# Patient Record
Sex: Male | Born: 1955 | Race: Black or African American | Hispanic: No | Smoking: Current every day smoker
Health system: Southern US, Community
[De-identification: ages and names within clinical notes are randomized; demographics above are authoritative.]

## PROBLEM LIST (undated history)

## (undated) DIAGNOSIS — N289 Disorder of kidney and ureter, unspecified: Secondary | ICD-10-CM

## (undated) DIAGNOSIS — I10 Essential (primary) hypertension: Secondary | ICD-10-CM

---

## 2016-04-03 ENCOUNTER — Encounter: Payer: Self-pay | Admitting: Emergency Medicine

## 2016-04-03 ENCOUNTER — Emergency Department: Payer: Medicaid Other

## 2016-04-03 ENCOUNTER — Inpatient Hospital Stay
Admission: EM | Admit: 2016-04-03 | Discharge: 2016-04-06 | DRG: 640 | Disposition: A | Discharge status: Home / Self Care | Payer: Medicaid Other | Attending: Internal Medicine | Admitting: Internal Medicine

## 2016-04-03 DIAGNOSIS — N186 End stage renal disease: Secondary | ICD-10-CM | POA: Diagnosis present

## 2016-04-03 DIAGNOSIS — I509 Heart failure, unspecified: Secondary | ICD-10-CM | POA: Diagnosis present

## 2016-04-03 DIAGNOSIS — F17210 Nicotine dependence, cigarettes, uncomplicated: Secondary | ICD-10-CM | POA: Diagnosis present

## 2016-04-03 DIAGNOSIS — J9601 Acute respiratory failure with hypoxia: Secondary | ICD-10-CM | POA: Diagnosis present

## 2016-04-03 DIAGNOSIS — Z79899 Other long term (current) drug therapy: Secondary | ICD-10-CM | POA: Diagnosis not present

## 2016-04-03 DIAGNOSIS — I132 Hypertensive heart and chronic kidney disease with heart failure and with stage 5 chronic kidney disease, or end stage renal disease: Secondary | ICD-10-CM | POA: Diagnosis present

## 2016-04-03 DIAGNOSIS — Z9111 Patient's noncompliance with dietary regimen: Secondary | ICD-10-CM

## 2016-04-03 DIAGNOSIS — Z23 Encounter for immunization: Secondary | ICD-10-CM | POA: Diagnosis not present

## 2016-04-03 DIAGNOSIS — I10 Essential (primary) hypertension: Secondary | ICD-10-CM

## 2016-04-03 DIAGNOSIS — R7989 Other specified abnormal findings of blood chemistry: Secondary | ICD-10-CM

## 2016-04-03 DIAGNOSIS — R042 Hemoptysis: Secondary | ICD-10-CM | POA: Diagnosis present

## 2016-04-03 DIAGNOSIS — Z992 Dependence on renal dialysis: Secondary | ICD-10-CM | POA: Diagnosis not present

## 2016-04-03 DIAGNOSIS — J811 Chronic pulmonary edema: Secondary | ICD-10-CM

## 2016-04-03 DIAGNOSIS — E8770 Fluid overload, unspecified: Principal | ICD-10-CM | POA: Diagnosis present

## 2016-04-03 DIAGNOSIS — E1122 Type 2 diabetes mellitus with diabetic chronic kidney disease: Secondary | ICD-10-CM | POA: Diagnosis present

## 2016-04-03 DIAGNOSIS — N2581 Secondary hyperparathyroidism of renal origin: Secondary | ICD-10-CM | POA: Diagnosis present

## 2016-04-03 DIAGNOSIS — R778 Other specified abnormalities of plasma proteins: Secondary | ICD-10-CM

## 2016-04-03 DIAGNOSIS — D72829 Elevated white blood cell count, unspecified: Secondary | ICD-10-CM

## 2016-04-03 DIAGNOSIS — I248 Other forms of acute ischemic heart disease: Secondary | ICD-10-CM | POA: Diagnosis present

## 2016-04-03 DIAGNOSIS — D631 Anemia in chronic kidney disease: Secondary | ICD-10-CM | POA: Diagnosis present

## 2016-04-03 DIAGNOSIS — R0902 Hypoxemia: Secondary | ICD-10-CM

## 2016-04-03 DIAGNOSIS — I379 Nonrheumatic pulmonary valve disorder, unspecified: Secondary | ICD-10-CM | POA: Diagnosis not present

## 2016-04-03 DIAGNOSIS — R0602 Shortness of breath: Secondary | ICD-10-CM | POA: Diagnosis present

## 2016-04-03 DIAGNOSIS — D649 Anemia, unspecified: Secondary | ICD-10-CM

## 2016-04-03 DIAGNOSIS — J189 Pneumonia, unspecified organism: Secondary | ICD-10-CM

## 2016-04-03 DIAGNOSIS — J810 Acute pulmonary edema: Secondary | ICD-10-CM

## 2016-04-03 HISTORY — DX: Disorder of kidney and ureter, unspecified: N28.9

## 2016-04-03 HISTORY — DX: Essential (primary) hypertension: I10

## 2016-04-03 LAB — COMPREHENSIVE METABOLIC PANEL
ALK PHOS: 154 U/L — AB (ref 38–126)
ALK PHOS: 143 U/L — AB (ref 38–126)
ALT: 28 U/L (ref 17–63)
ALT: 25 U/L (ref 17–63)
ANION GAP: 14 (ref 5–15)
AST: 32 U/L (ref 15–41)
AST: 26 U/L (ref 15–41)
Albumin: 4 g/dL (ref 3.5–5.0)
Albumin: 3.7 g/dL (ref 3.5–5.0)
Anion gap: 13 (ref 5–15)
BILIRUBIN TOTAL: 0.7 mg/dL (ref 0.3–1.2)
BILIRUBIN TOTAL: 0.8 mg/dL (ref 0.3–1.2)
BUN: 56 mg/dL — ABNORMAL HIGH (ref 6–20)
BUN: 48 mg/dL — ABNORMAL HIGH (ref 6–20)
CALCIUM: 9.1 mg/dL (ref 8.9–10.3)
CALCIUM: 8.9 mg/dL (ref 8.9–10.3)
CO2: 26 mmol/L (ref 22–32)
CO2: 26 mmol/L (ref 22–32)
CREATININE: 7.73 mg/dL — AB (ref 0.61–1.24)
Chloride: 102 mmol/L (ref 101–111)
Chloride: 100 mmol/L — ABNORMAL LOW (ref 101–111)
Creatinine, Ser: 9.21 mg/dL — ABNORMAL HIGH (ref 0.61–1.24)
GFR calc Af Amer: 6 mL/min — ABNORMAL LOW (ref >60)
GFR, EST AFRICAN AMERICAN: 8 mL/min — AB (ref >60)
GFR, EST NON AFRICAN AMERICAN: 5 mL/min — AB (ref >60)
GFR, EST NON AFRICAN AMERICAN: 7 mL/min — AB (ref >60)
Glucose, Bld: 93 mg/dL (ref 65–99)
Glucose, Bld: 88 mg/dL (ref 65–99)
POTASSIUM: 4.6 mmol/L (ref 3.5–5.1)
Potassium: 4.1 mmol/L (ref 3.5–5.1)
Sodium: 141 mmol/L (ref 135–145)
Sodium: 140 mmol/L (ref 135–145)
TOTAL PROTEIN: 8.7 g/dL — AB (ref 6.5–8.1)
TOTAL PROTEIN: 8.2 g/dL — AB (ref 6.5–8.1)

## 2016-04-03 LAB — CBC
HCT: 35.7 % — ABNORMAL LOW (ref 40.0–52.0)
HEMOGLOBIN: 11.8 g/dL — AB (ref 13.0–18.0)
MCH: 28.7 pg (ref 26.0–34.0)
MCHC: 33.1 g/dL (ref 32.0–36.0)
MCV: 86.7 fL (ref 80.0–100.0)
Platelets: 186 10*3/uL (ref 150–440)
RBC: 4.12 MIL/uL — ABNORMAL LOW (ref 4.40–5.90)
RDW: 14.1 % (ref 11.5–14.5)
WBC: 11.4 10*3/uL — AB (ref 3.8–10.6)

## 2016-04-03 LAB — PHOSPHORUS: Phosphorus: 3.5 mg/dL (ref 2.5–4.6)

## 2016-04-03 LAB — TROPONIN I
TROPONIN I: 0.03 ng/mL — AB (ref <0.03)
TROPONIN I: 0.05 ng/mL — AB (ref <0.03)
TROPONIN I: 0.04 ng/mL — AB (ref <0.03)

## 2016-04-03 LAB — MRSA PCR SCREENING: MRSA by PCR: NEGATIVE

## 2016-04-03 LAB — TSH: TSH: 1.156 u[IU]/mL (ref 0.350–4.500)

## 2016-04-03 LAB — PROCALCITONIN: PROCALCITONIN: 1.1 ng/mL

## 2016-04-03 LAB — GLUCOSE, CAPILLARY: Glucose-Capillary: 87 mg/dL (ref 65–99)

## 2016-04-03 MED ORDER — ONDANSETRON HCL 4 MG/2ML IJ SOLN: 4.0000 mg | Freq: Four times a day (QID) | INTRAMUSCULAR | Status: DC | PRN

## 2016-04-03 MED ORDER — ASPIRIN EC 81 MG PO TBEC
81.0000 mg | DELAYED_RELEASE_TABLET | Freq: Every day | ORAL | Status: DC
  Administered 2016-04-03 – 2016-04-06 (×4): 81 mg via ORAL
  Filled 2016-04-03 (×4): qty 1

## 2016-04-03 MED ORDER — MORPHINE SULFATE (PF) 4 MG/ML IV SOLN
2.0000 mg | INTRAVENOUS | Status: DC | PRN
  Administered 2016-04-03: 4 mg via INTRAVENOUS
  Administered 2016-04-04 (×2): 2 mg via INTRAVENOUS
  Administered 2016-04-05 (×3): 4 mg via INTRAVENOUS
  Filled 2016-04-03 (×6): qty 1

## 2016-04-03 MED ORDER — ACETAMINOPHEN 325 MG PO TABS
650.0000 mg | ORAL_TABLET | Freq: Four times a day (QID) | ORAL | Status: DC | PRN
  Administered 2016-04-03: 650 mg via ORAL
  Filled 2016-04-03: qty 2

## 2016-04-03 MED ORDER — SODIUM CHLORIDE 0.9% FLUSH
3.0000 mL | Freq: Two times a day (BID) | INTRAVENOUS | Status: DC
  Administered 2016-04-03 – 2016-04-05 (×5): 3 mL via INTRAVENOUS

## 2016-04-03 MED ORDER — SODIUM CHLORIDE 0.9% FLUSH
3.0000 mL | Freq: Two times a day (BID) | INTRAVENOUS | Status: DC
  Administered 2016-04-03: 3 mL via INTRAVENOUS

## 2016-04-03 MED ORDER — AMLODIPINE BESYLATE 10 MG PO TABS
10.0000 mg | ORAL_TABLET | Freq: Every day | ORAL | Status: DC
Start: 2016-04-04 — End: 2016-04-06
  Administered 2016-04-04 – 2016-04-06 (×3): 10 mg via ORAL
  Filled 2016-04-03 (×3): qty 1

## 2016-04-03 MED ORDER — HYDRALAZINE HCL 20 MG/ML IJ SOLN
10.0000 mg | INTRAMUSCULAR | Status: DC | PRN
  Administered 2016-04-03: 20 mg via INTRAVENOUS
  Administered 2016-04-03: 10 mg via INTRAVENOUS
  Administered 2016-04-03: 20 mg via INTRAVENOUS
  Filled 2016-04-03 (×2): qty 1

## 2016-04-03 MED ORDER — HYDRALAZINE HCL 20 MG/ML IJ SOLN
10.0000 mg | INTRAMUSCULAR | Status: DC | PRN
  Filled 2016-04-03: qty 1

## 2016-04-03 MED ORDER — SODIUM CHLORIDE 0.9 % IV SOLN: 250.0000 mL | INTRAVENOUS | Status: DC | PRN

## 2016-04-03 MED ORDER — PNEUMOCOCCAL VAC POLYVALENT 25 MCG/0.5ML IJ INJ
0.5000 mL | INJECTION | INTRAMUSCULAR | Status: AC
  Administered 2016-04-04: 0.5 mL via INTRAMUSCULAR
  Filled 2016-04-03: qty 0.5

## 2016-04-03 MED ORDER — NITROGLYCERIN 2 % TD OINT
1.0000 [in_us] | TOPICAL_OINTMENT | Freq: Four times a day (QID) | TRANSDERMAL | Status: DC
  Administered 2016-04-03 – 2016-04-06 (×10): 1 [in_us] via TOPICAL
  Filled 2016-04-03 (×10): qty 1

## 2016-04-03 MED ORDER — LEVOFLOXACIN IN D5W 750 MG/150ML IV SOLN
750.0000 mg | Freq: Once | INTRAVENOUS | Status: DC
  Administered 2016-04-03: 750 mg via INTRAVENOUS
  Filled 2016-04-03: qty 150

## 2016-04-03 MED ORDER — ONDANSETRON HCL 4 MG PO TABS: 4.0000 mg | ORAL_TABLET | Freq: Four times a day (QID) | ORAL | Status: DC | PRN

## 2016-04-03 MED ORDER — LOSARTAN POTASSIUM 50 MG PO TABS
100.0000 mg | ORAL_TABLET | Freq: Every day | ORAL | Status: DC
  Administered 2016-04-04 – 2016-04-06 (×3): 100 mg via ORAL
  Filled 2016-04-03 (×4): qty 2

## 2016-04-03 MED ORDER — SODIUM CHLORIDE 0.9% FLUSH: 3.0000 mL | INTRAVENOUS | Status: DC | PRN

## 2016-04-03 MED ORDER — NITROGLYCERIN 2 % TD OINT
1.0000 [in_us] | TOPICAL_OINTMENT | Freq: Once | TRANSDERMAL | Status: AC
  Administered 2016-04-03: 1 [in_us] via TOPICAL
  Filled 2016-04-03: qty 1

## 2016-04-03 MED ORDER — HYDROCODONE-ACETAMINOPHEN 5-325 MG PO TABS
1.0000 | ORAL_TABLET | ORAL | Status: DC | PRN
  Administered 2016-04-04: 2 via ORAL
  Filled 2016-04-03: qty 2

## 2016-04-03 MED ORDER — LEVOFLOXACIN 250 MG PO TABS
250.0000 mg | ORAL_TABLET | ORAL | Status: DC
  Filled 2016-04-03: qty 1

## 2016-04-03 MED ORDER — HYDRALAZINE HCL 50 MG PO TABS
50.0000 mg | ORAL_TABLET | Freq: Three times a day (TID) | ORAL | Status: DC
  Administered 2016-04-03 – 2016-04-04 (×4): 50 mg via ORAL
  Filled 2016-04-03 (×6): qty 1

## 2016-04-03 MED ORDER — ACETAMINOPHEN 650 MG RE SUPP: 650.0000 mg | Freq: Four times a day (QID) | RECTAL | Status: DC | PRN

## 2016-04-03 MED ORDER — METOPROLOL TARTRATE 50 MG PO TABS
100.0000 mg | ORAL_TABLET | Freq: Two times a day (BID) | ORAL | Status: DC
  Administered 2016-04-03 – 2016-04-06 (×5): 100 mg via ORAL
  Filled 2016-04-03 (×5): qty 2

## 2016-04-03 NOTE — Plan of Care (Signed)
Problem: Physical Regulation: Goal: Will remain free from infection Outcome: Not Progressing Variance: Slow or unresponsive to therapy Comments: Patient has a temp of 102.1 and SBP over 190. Slow to respond to IV antihypertensives.

## 2016-04-03 NOTE — Progress Notes (Signed)
CH responded to a OR for an AD. Pt was awake. CH educated on the AD. Pt will review and complete later. CH is available for follow up as needed.    04/03/16 1700  Clinical Encounter Type  Visited With Patient;Patient and family together  Visit Type Initial;Spiritual support  Referral From Nurse  Consult/Referral To Chaplain  Spiritual Encounters  Spiritual Needs Literature

## 2016-04-03 NOTE — H&P (Addendum)
[facility] - [place] at [facility]   PATIENT NAME: Terry Riley    MR#:  [id number]  DATE OF BIRTH:  Sep 02, 1955  DATE OF ADMISSION:  04/03/2016  PRIMARY CARE PHYSICIAN: No primary care provider on file.   REQUESTING/REFERRING PHYSICIAN:   CHIEF COMPLAINT:   Chief Complaint  Patient presents with  . Shortness of Breath    HISTORY OF PRESENT ILLNESS: Nikola Marone  is a 61 y.o. male with a known history of End-stage renal disease, initiated September 2016, left upper extremity graft, hypertension, who presents to the hospital with complaints of significant shortness of breath, resected yesterday. Patient had his hemodialysis on March 27, he felt relatively good, however, yesterday started having coughing with blood, frothy phlegm, no fevers. On arrival to emergency room, patient's oxygen saturations were in the 70s on room air, patient was initiated on 6 L of oxygen with improvement to 91%. Hospitalist services were contacted for admission. The patient complains of two-pillow orthopnea and PND.   PAST MEDICAL HISTORY:   Past Medical History:  Diagnosis Date  . Hypertension   . Renal disorder     PAST SURGICAL HISTORY: No past surgical history on file.  SOCIAL HISTORY:  Social History  Substance Use Topics  . Smoking status: Current Every Day Smoker    Packs/day: 0.50    Types: Cigarettes  . Smokeless tobacco: Never Used  . Alcohol use Yes    FAMILY HISTORY: No Early coronary artery disease.  DRUG ALLERGIES: No Known Allergies  Review of Systems  Constitutional: Positive for malaise/fatigue and weight loss. Negative for chills and fever.  HENT: Negative for congestion.   Eyes: Negative for blurred vision and double vision.  Respiratory: Positive for cough, hemoptysis, shortness of breath and wheezing. Negative for sputum production.   Cardiovascular: Positive for chest pain, palpitations, orthopnea, leg swelling and PND.  Gastrointestinal:  Positive for blood in stool, constipation and diarrhea. Negative for abdominal pain, nausea and vomiting.  Genitourinary: Negative for dysuria, frequency, hematuria and urgency.  Musculoskeletal: Negative for falls.  Neurological: Negative for dizziness, tremors, focal weakness and headaches.  Endo/Heme/Allergies: Does not bruise/bleed easily.  Psychiatric/Behavioral: Negative for depression. The patient does not have insomnia.     MEDICATIONS AT HOME:  Prior to Admission medications   Medication Sig Start Date End Date Taking? Authorizing Provider  amLODipine (NORVASC) 10 MG tablet Take 10 mg by mouth daily.   Yes Historical Provider, MD  hydrALAZINE (APRESOLINE) 50 MG tablet Take 50 mg by mouth 3 (three) times daily.   Yes Historical Provider, MD  losartan (COZAAR) 100 MG tablet Take 100 mg by mouth daily.   Yes Historical Provider, MD  metoprolol (LOPRESSOR) 100 MG tablet Take 100 mg by mouth 2 (two) times daily.   Yes Historical Provider, MD      PHYSICAL EXAMINATION:   VITAL SIGNS: Blood pressure (!) 178/88, temperature 99.2 F (37.3 C), temperature source Oral, resp. rate 18, height 6' (1.829 m), weight 77.1 kg (170 lb), SpO2 (!) 77 %.  GENERAL:  61 y.o.-year-old patient lying in the bed In moderate respiratory distress.  EYES: Pupils equal, round, reactive to light and accommodation. No scleral icterus. Extraocular muscles intact.  HEENT: Head atraumatic, normocephalic. Oropharynx and nasopharynx clear.  NECK:  Supple, significant jugular venous distention up to the mandible angle. No thyroid enlargement, no tenderness.  LUNGS: Some diminished breath sounds bilaterally, no wheezing, bilateral rales,rhonchi and crepitations , more on the right side. Intermittent use of  accessory muscles of respiration. Tachypneic, uncomfortable, somewhat sleepy CARDIOVASCULAR: S1, S2 , tachycardic. No murmurs, rubs, or gallops.  ABDOMEN: Soft, nontender, nondistended. Bowel sounds present. No  organomegaly or mass.  EXTREMITIES: Trace lower extremity and pedal edema, no cyanosis, or clubbing.  NEUROLOGIC: Cranial nerves II through XII are intact. Muscle strength 5/5 in all extremities. Sensation intact. Gait not checked.  PSYCHIATRIC: The patient is somnolent, however, able to open his eyes, converses, oriented x 3.  SKIN: No obvious rash, lesion, or ulcer.   LABORATORY PANEL:   CBC  Recent Labs Lab 04/03/16 1133  WBC 11.4*  HGB 11.8*  HCT 35.7*  PLT 186  MCV 86.7  MCH 28.7  MCHC 33.1  RDW 14.1   ------------------------------------------------------------------------------------------------------------------  Chemistries   Recent Labs Lab 04/03/16 1133  NA 141  K 4.6  CL 102  CO2 26  GLUCOSE 93  BUN 56*  CREATININE 9.21*  CALCIUM 9.1  AST 32  ALT 28  ALKPHOS 154*  BILITOT 0.7   ------------------------------------------------------------------------------------------------------------------  Cardiac Enzymes  Recent Labs Lab 04/03/16 1133  TROPONINI 0.03*   ------------------------------------------------------------------------------------------------------------------  RADIOLOGY: Dg Chest 2 View  Result Date: 04/03/2016 CLINICAL DATA:  Pt dialysis patient, reports he had dialysis on Friday, was released Saturday and reports shortness of breath started today. Fiance reports pt has been drinking alcohol, smoking cigarettes and eating bad foods for him. Pt isn't able to start new dialysis until Tuesday. Hx/o HT; smoker. EXAM: CHEST  2 VIEW COMPARISON:  None. FINDINGS: Cardiac silhouette is top-normal in size. No mediastinal or hilar masses. No convincing adenopathy. There are irregular interstitial opacities and hazy airspace opacity bilaterally, most evident on the right. Thickening of the fissures is noted. The lungs are hyperexpanded. There is no pleural effusion. No pneumothorax. Skeletal structures are intact. IMPRESSION: 1. Interstitial and hazy  airspace opacities most evident on the right. Pneumonia is suspected. Alternatively, this could reflect asymmetric pulmonary edema. Electronically Signed   By: Amie Portland M.D.   On: 04/03/2016 11:52    EKG: Orders placed or performed during the hospital encounter of 04/03/16  . EKG 12-Lead  . EKG 12-Lead  EKG in the emergency room revealed normal sinus rhythm at 93 beats per minute, left axis deviation, possible left atrial enlargement. EKG criteria, nonspecific ST-T changes.   IMPRESSION AND PLAN:  Active Problems:   Acute pulmonary edema (HCC)   Acute respiratory failure with hypoxia (HCC)   Malignant essential hypertension   ESRD (end stage renal disease) (HCC)   Elevated troponin   Leukocytosis   Anemia  #1. Acute respiratory failure with hypoxia due to acute pulmonary edema, questionable pneumonia, admit to medical floor, get hemodialysis emergently, discussed with nephrologist already, continue oxygen therapy as needed, keeping pulse oximetry at 88-92%, recheck chest x-ray in the morning to rule out underlying pneumonia, continue levofloxacin orally, get sputum cultures if possible #2. Acute pulmonary edema, get echocardiogram, continue nitroglycerin topically #3. Malignant essential hypertension, resume all outpatient medications, add nitroglycerin topically, advance medications as needed, dialysis today #4. Elevated troponin, continue metoprolol, aspirin, unable to use anticoagulation due to hemoptysis, follow cardiac enzymes 3, get echocardiogram, cardiologist consultation #5. Leukocytosis, etiology is unclear, rule out pneumonia, initiate patient on levofloxacin, follow closely #6. Tobacco abuse. Counseling, discussed this patient for 4 minutes, nicotine replacement therapy will be initiated, patient is agreeable   All the records are reviewed and case discussed with ED provider. Management plans discussed with the patient, family and they are in agreement.  CODE  STATUS: Code Status History    This patient does not have a recorded code status. Please follow your organizational policy for patients in this situation.       TOTAL Critical care TIME TAKING CARE OF THIS PATIENT: 50 minutes.    Katharina Caper M.D on 04/03/2016 at 1:09 PM  Between 7am to 6pm - Pager - [phone] After 6pm go to [email or web address] - password [email or web address] [facility]  [facility]  Office  [phone]  CC: Primary care physician; No primary care provider on file.

## 2016-04-03 NOTE — Progress Notes (Signed)
HD COMPLETED  

## 2016-04-03 NOTE — Consult Note (Signed)
[facility]  CONSULT NOTE    Date: 04/03/2016                  Patient Name:  Terry Riley  MRN: [id number]  DOB: Mar 05, 1955  Age / Sex: 61 y.o., male         PCP: Pcp Not In System                 Service Requesting Consult: Dr. Winona Legato                 Reason for Consult: End Stage Renal Disease            History of Present Illness: [name] is a 61 y.o. black male with End Stage Renal Disease on hemodialysis since 09/2014 with left arm AVG, hypertension, diabetes mellitus cholecystectomy, who was admitted to [facility] on 04/03/2016 for Pulmonary edema [J81.1] Hypoxia [R09.02] Community acquired pneumonia of right lung, unspecified part of lung [J18.9]   Patient was released from prison on Friday. He did get dialysis Friday. He came to [place] with his fiance to celebrate his release. Went to [employer] where he consumed a large amount of salty food and smoked cigarettes.   He started to have shortness of breath, cough with hemoptysis and chest pain. Presented to ED where CXR shows pulmonary edema. Placed on oxygen, nitro paste. He has accelerated high blood pressure.  He is scheduled to start hemodialysis at [facility] in [place] on TTS following with [facility].     Medications: Outpatient medications: Prescriptions Prior to Admission  Medication Sig Dispense Refill Last Dose  . amLODipine (NORVASC) 10 MG tablet Take 10 mg by mouth daily.   04/03/2016 at 0800  . hydrALAZINE (APRESOLINE) 50 MG tablet Take 50 mg by mouth 3 (three) times daily.   04/03/2016 at 0800  . losartan (COZAAR) 100 MG tablet Take 100 mg by mouth daily.   04/03/2016 at 0800  . metoprolol (LOPRESSOR) 100 MG tablet Take 100 mg by mouth 2 (two) times daily.   04/03/2016 at 0800    Current medications: Current Facility-Administered Medications  Medication Dose Route Frequency Provider Last Rate Last Dose  . 0.9 %  sodium chloride infusion  250 mL Intravenous PRN Katharina Caper, MD       . acetaminophen (TYLENOL) tablet 650 mg  650 mg Oral Q6H PRN Katharina Caper, MD       Or  . acetaminophen (TYLENOL) suppository 650 mg  650 mg Rectal Q6H PRN Katharina Caper, MD      . Melene Muller ON 04/04/2016] amLODipine (NORVASC) tablet 10 mg  10 mg Oral Daily Katharina Caper, MD      . aspirin EC tablet 81 mg  81 mg Oral Daily Katharina Caper, MD   81 mg at 04/03/16 1517  . hydrALAZINE (APRESOLINE) injection 10-40 mg  10-40 mg Intravenous Q4H PRN Merwyn Katos, MD   10 mg at 04/03/16 1519  . hydrALAZINE (APRESOLINE) tablet 50 mg  50 mg Oral TID Katharina Caper, MD      . HYDROcodone-acetaminophen (NORCO/VICODIN) 5-325 MG per tablet 1-2 tablet  1-2 tablet Oral Q4H PRN Katharina Caper, MD      . Melene Muller ON 04/04/2016] levofloxacin (LEVAQUIN) tablet 250 mg  250 mg Oral Q48H Katharina Caper, MD      . Melene Muller ON 04/04/2016] losartan (COZAAR) tablet 100 mg  100 mg Oral Daily Katharina Caper, MD      . metoprolol (LOPRESSOR) tablet 100 mg  100 mg Oral BID Katharina Caper, MD      . morphine 4 MG/ML injection 2-4 mg  2-4 mg Intravenous Q3H PRN Merwyn Katos, MD   4 mg at 04/03/16 1518  . nitroGLYCERIN (NITROGLYN) 2 % ointment 1 inch  1 inch Topical Q6H Katharina Caper, MD      . ondansetron (ZOFRAN) tablet 4 mg  4 mg Oral Q6H PRN Katharina Caper, MD       Or  . ondansetron (ZOFRAN) injection 4 mg  4 mg Intravenous Q6H PRN Katharina Caper, MD      . sodium chloride flush (NS) 0.9 % injection 3 mL  3 mL Intravenous Q12H Katharina Caper, MD      . sodium chloride flush (NS) 0.9 % injection 3 mL  3 mL Intravenous Q12H Katharina Caper, MD      . sodium chloride flush (NS) 0.9 % injection 3 mL  3 mL Intravenous PRN Katharina Caper, MD          Allergies: No Known Allergies    Past Medical History: Past Medical History:  Diagnosis Date  . Hypertension   . Renal disorder      Past Surgical History: No past surgical history on file.   Family History: No family history on file.   Social History: Social History    Social History  . Marital status: Widowed    Spouse name: N/A  . Number of children: N/A  . Years of education: N/A   Occupational History  . Not on file.   Social History Main Topics  . Smoking status: Current Every Day Smoker    Packs/day: 0.50    Types: Cigarettes  . Smokeless tobacco: Never Used  . Alcohol use Yes  . Drug use: Unknown  . Sexual activity: Not on file   Other Topics Concern  . Not on file   Social History Narrative  . No narrative on file     Review of Systems: Review of Systems  Constitutional: Positive for diaphoresis, malaise/fatigue and weight loss. Negative for chills and fever.  HENT: Negative.  Negative for congestion, ear discharge, ear pain, hearing loss, nosebleeds, sinus pain, sore throat and tinnitus.   Eyes: Negative for blurred vision, double vision, photophobia, pain, discharge and redness.  Respiratory: Positive for cough, hemoptysis, sputum production, shortness of breath and wheezing. Negative for stridor.   Cardiovascular: Positive for chest pain. Negative for palpitations, orthopnea, claudication, leg swelling and PND.  Gastrointestinal: Negative for abdominal pain, blood in stool, constipation, diarrhea, heartburn, melena, nausea and vomiting.  Genitourinary: Negative for dysuria, flank pain, frequency, hematuria and urgency.  Musculoskeletal: Negative for back pain, falls, joint pain, myalgias and neck pain.  Skin: Negative for itching and rash.  Neurological: Positive for weakness. Negative for dizziness, tingling, tremors, sensory change, speech change, focal weakness, seizures, loss of consciousness and headaches.  Endo/Heme/Allergies: Negative for environmental allergies and polydipsia. Does not bruise/bleed easily.  Psychiatric/Behavioral: Negative for depression, hallucinations, memory loss, substance abuse and suicidal ideas. The patient is not nervous/anxious and does not have insomnia.     Vital Signs: Blood pressure (!)  181/96, pulse 94, temperature 98.5 F (36.9 C), temperature source Oral, resp. rate 20, height 6' (1.829 m), weight 72.6 kg (160 lb 0.9 oz), SpO2 (!) 87 %.  Weight trends: Filed Weights   04/03/16 1113 04/03/16 1427  Weight: 77.1 kg (170 lb) 72.6 kg (160 lb 0.9 oz)    Physical Exam: General: In respiratory distress  Head: Normocephalic,  atraumatic. Moist oral mucosal membranes  Eyes: Anicteric, PERRL  Neck: +JVD  Lungs:  Bilateral crackles and wheezes  Heart: Regular rate and rhythm  Abdomen:  Soft, nontender  Extremities: no peripheral edema.  Neurologic: Nonfocal, moving all four extremities  Skin: No lesions  Access: Left arm AVG     Lab results: Basic Metabolic Panel:  Recent Labs Lab 04/03/16 1133  NA 141  K 4.6  CL 102  CO2 26  GLUCOSE 93  BUN 56*  CREATININE 9.21*  CALCIUM 9.1    Liver Function Tests:  Recent Labs Lab 04/03/16 1133  AST 32  ALT 28  ALKPHOS 154*  BILITOT 0.7  PROT 8.7*  ALBUMIN 4.0   No results for input(s): LIPASE, AMYLASE in the last 168 hours. No results for input(s): AMMONIA in the last 168 hours.  CBC:  Recent Labs Lab 04/03/16 1133  WBC 11.4*  HGB 11.8*  HCT 35.7*  MCV 86.7  PLT 186    Cardiac Enzymes:  Recent Labs Lab 04/03/16 1133 04/03/16 1433  TROPONINI 0.03* 0.05*    BNP: Invalid input(s): POCBNP  CBG:  Recent Labs Lab 04/03/16 1431  GLUCAP 87    Microbiology: No results found for this or any previous visit.  Coagulation Studies: No results for input(s): LABPROT, INR in the last 72 hours.  Urinalysis: No results for input(s): COLORURINE, LABSPEC, PHURINE, GLUCOSEU, HGBUR, BILIRUBINUR, KETONESUR, PROTEINUR, UROBILINOGEN, NITRITE, LEUKOCYTESUR in the last 72 hours.  Invalid input(s): APPERANCEUR    Imaging: Dg Chest 2 View  Result Date: 04/03/2016 CLINICAL DATA:  Pt dialysis patient, reports he had dialysis on Friday, was released Saturday and reports shortness of breath started today.  Fiance reports pt has been drinking alcohol, smoking cigarettes and eating bad foods for him. Pt isn't able to start new dialysis until Tuesday. Hx/o HT; smoker. EXAM: CHEST  2 VIEW COMPARISON:  None. FINDINGS: Cardiac silhouette is top-normal in size. No mediastinal or hilar masses. No convincing adenopathy. There are irregular interstitial opacities and hazy airspace opacity bilaterally, most evident on the right. Thickening of the fissures is noted. The lungs are hyperexpanded. There is no pleural effusion. No pneumothorax. Skeletal structures are intact. IMPRESSION: 1. Interstitial and hazy airspace opacities most evident on the right. Pneumonia is suspected. Alternatively, this could reflect asymmetric pulmonary edema. Electronically Signed   By: Amie Portland M.D.   On: 04/03/2016 11:52      Assessment & Plan: Mr. Jamaar Howes is a 61 y.o. black male with End Stage Renal Disease on hemodialysis since 09/2014 with left arm AVG, hypertension, diabetes mellitus type II, cholecystectomy, who was admitted to [facility] on 04/03/2016   TTS [facility] [place] Left Arm AVG  1. End Stage Renal Disease on hemodialysis: with pulmonary edema and accelerated hypertension.  - Emergent hemodialysis today. Orders prepared. Goal of 3 litres on dialysis  2. Hypertension: accelerated. With chest pain.  - restart home regimen: amlodipine, metoprolol, losartan, and hydralazine - NTG for chest pain.   3. Anemia of chronic kidney disease: hemoglobin 11.8. Holding epo.   4. Secondary Hyperparathyroidism: calcium at goal. Not currently on binders.  - Check PTH and phosphorus   LOS: 0 Ilyaas Musto 4/1/20183:31 PM

## 2016-04-03 NOTE — Consult Note (Signed)
[facility] Card[facility]  CARDIOLOGY CONSULT NOTE  Patient ID: Terry Riley MRN: [id number] DOB/AGE: 11-May-1955 61 y.o.  Admit date: 04/03/2016 Referring Physician Winona Legato Primary Physician none Primary Cardiologist  Reason for Consultation Congestive heart failure  HPI: 61 year old gentleman referred for evaluation of congestive heart failure. The patient has known history of end-stage renal disease on chronic hemodialysis. He was just released from a correctional facility, celebrated with a high salt, and developed fluid retention and shortness of breath. He presented to [facility] emergency room where he was noted to be toxic requiring 6 L of oxygen. ECG was nondiagnostic. Initial troponin was 0.03. Patient complained of chest pain when coughing. Blood pressure was elevated.  Review of systems complete and found to be negative unless listed above     Past Medical History:  Diagnosis Date  . Hypertension   . Renal disorder     No past surgical history on file.  Prescriptions Prior to Admission  Medication Sig Dispense Refill Last Dose  . amLODipine (NORVASC) 10 MG tablet Take 10 mg by mouth daily.   04/03/2016 at 0800  . hydrALAZINE (APRESOLINE) 50 MG tablet Take 50 mg by mouth 3 (three) times daily.   04/03/2016 at 0800  . losartan (COZAAR) 100 MG tablet Take 100 mg by mouth daily.   04/03/2016 at 0800  . metoprolol (LOPRESSOR) 100 MG tablet Take 100 mg by mouth 2 (two) times daily.   04/03/2016 at 0800   Social History   Social History  . Marital status: Widowed    Spouse name: N/A  . Number of children: N/A  . Years of education: N/A   Occupational History  . Not on file.   Social History Main Topics  . Smoking status: Current Every Day Smoker    Packs/day: 0.50    Types: Cigarettes  . Smokeless tobacco: Never Used  . Alcohol use Yes  . Drug use: Unknown  . Sexual activity: Not on file   Other Topics Concern  . Not on file   Social History Narrative  . No narrative on file    No  family history on file.    Review of systems complete and found to be negative unless listed above      PHYSICAL EXAM  General: Well developed, well nourished, in no acute distress HEENT:  Normocephalic and atramatic Neck:  No JVD.  Lungs: Clear bilaterally to auscultation and percussion. Heart: HRRR . Normal S1 and S2 without gallops or murmurs.  Abdomen: Bowel sounds are positive, abdomen soft and non-tender  Msk:  Back normal, normal gait. Normal strength and tone for age. Extremities: No clubbing, cyanosis or edema.   Neuro: Alert and oriented X 3. Psych:  Good affect, responds appropriately  Labs:   Lab Results  Component Value Date   WBC 11.4 (H) 04/03/2016   HGB 11.8 (L) 04/03/2016   HCT 35.7 (L) 04/03/2016   MCV 86.7 04/03/2016   PLT 186 04/03/2016    Recent Labs Lab 04/03/16 1133  NA 141  K 4.6  CL 102  CO2 26  BUN 56*  CREATININE 9.21*  CALCIUM 9.1  PROT 8.7*  BILITOT 0.7  ALKPHOS 154*  ALT 28  AST 32  GLUCOSE 93   Lab Results  Component Value Date   TROPONINI 0.03 (HH) 04/03/2016   No results found for: CHOL No results found for: HDL No results found for: LDLCALC No results found for: TRIG No results found for: CHOLHDL No results found for: LDLDIRECT  Radiology: Dg Chest 2 View  Result Date: 04/03/2016 CLINICAL DATA:  Pt dialysis patient, reports he had dialysis on Friday, was released Saturday and reports shortness of breath started today. Fiance reports pt has been drinking alcohol, smoking cigarettes and eating bad foods for him. Pt isn't able to start new dialysis until Tuesday. Hx/o HT; smoker. EXAM: CHEST  2 VIEW COMPARISON:  None. FINDINGS: Cardiac silhouette is top-normal in size. No mediastinal or hilar masses. No convincing adenopathy. There are irregular interstitial opacities and hazy airspace opacity bilaterally, most evident on the right. Thickening of the fissures is noted. The lungs are hyperexpanded. There is no pleural  effusion. No pneumothorax. Skeletal structures are intact. IMPRESSION: 1. Interstitial and hazy airspace opacities most evident on the right. Pneumonia is suspected. Alternatively, this could reflect asymmetric pulmonary edema. Electronically Signed   By: Amie Portland M.D.   On: 04/03/2016 11:52    EKG: Normal sinus rhythm  ASSESSMENT AND PLAN:   1. Congestive heart failure, and O2 dietary noncompliance, and end-stage renal disease 2. Chest pain, atypical when coughing, negative troponin, nondiagnostic ECG 3. End-stage renal disease  Recommendations  1. Agree with current therapy 2. Defer full dose anticoagulation 3. Continue to cycle cardiac enzymes 4. Proceed with dialysis 5. Defer further cardiac diagnostics at this time   Signed: Marcina Millard MD,PhD, [facility] 04/03/2016, 3:15 PM

## 2016-04-03 NOTE — Progress Notes (Signed)
OST DIALYSIS ASSESSMENT

## 2016-04-03 NOTE — ED Notes (Signed)
Pt oxygen sat 86% on 4L, pt placed on 6L. MD to bedside.

## 2016-04-03 NOTE — ED Provider Notes (Signed)
[facility] Emergency Department Provider Note  Time seen: 11:40 AM  I have reviewed the triage vital signs and the nursing notes.   HISTORY  Chief Complaint Shortness of Breath    HPI Terry Riley is a 61 y.o. male with a past medical history of hypertension, end-stage renal disease on hemodialysis Monday, Wednesday, Friday, presents to the emergency department for shortness of breath. Patient states he had been in jail receiving his dialysis Monday, Wednesday, Friday, he was released Saturday and his next dialysis is scheduled for Tuesday. Patient states increased shortness of breath since this morning which became severe so the patient came to the emergency department for evaluation. Patient does not normally wear oxygen at baseline currently satting 77% on room air. Patient denies any chest pain. He does state increased shortness of breath starting this morning.  Past Medical History:  Diagnosis Date  . Hypertension   . Renal disorder     There are no active problems to display for this patient.   No past surgical history on file.  Prior to Admission medications   Not on File    No Known Allergies  No family history on file.  Social History Social History  Substance Use Topics  . Smoking status: Current Every Day Smoker    Packs/day: 0.50    Types: Cigarettes  . Smokeless tobacco: Never Used  . Alcohol use Yes    Review of Systems Constitutional: Negative for fever. Cardiovascular: Negative for chest pain. Respiratory: Moderate shortness of breath. Gastrointestinal: Negative for abdominal pain Neurological: Negative for headache 10-point ROS otherwise negative.  ____________________________________________   PHYSICAL EXAM:  VITAL SIGNS: ED Triage Vitals  Enc Vitals Group     BP 04/03/16 1108 (!) 178/88     Pulse --      Resp 04/03/16 1108 18     Temp 04/03/16 1108 99.2 F (37.3 C)     Temp Source 04/03/16 1108 Oral   SpO2 04/03/16 1108 (!) 77 %     Weight 04/03/16 1113 170 lb (77.1 kg)     Height 04/03/16 1113 6' (1.829 m)     Head Circumference --      Peak Flow --      Pain Score --      Pain Loc --      Pain Edu? --      Excl. in GC? --    Constitutional: Alert and oriented. Well appearing and in no distress. Eyes: Normal exam ENT   Head: Normocephalic and atraumatic   Mouth/Throat: Mucous membranes are moist. Cardiovascular: Normal rate, regular rhythm. No murmur Respiratory: Mild tachypnea, mild rales bilaterally. Gastrointestinal: Soft and nontender. No distention. Musculoskeletal: Nontender with normal range of motion in all extremities. Neurologic:  Normal speech and language. No gross focal neurologic deficits Skin:  Skin is warm, dry and intact.  Psychiatric: Mood and affect are normal. Speech and behavior are normal.   ____________________________________________    EKG  EKG reviewed and interpreted by myself shows normal sinus rhythm at 93 bpm, narrow QRS, normal axis, normal intervals, no concerning ST changes.  ____________________________________________    RADIOLOGY  Chest x-ray shows asymmetric edema versus right-sided pneumonia.  ____________________________________________   INITIAL IMPRESSION / ASSESSMENT AND PLAN / ED COURSE  Pertinent labs & imaging results that were available during my care of the patient were reviewed by me and considered in my medical decision making (see chart for details).  Patient presents emergency department with shortness of breath  with a room air saturation of 77%. Patient has mild rales bilaterally with an elevated blood pressure 178/88. Suspect likely pulmonary edema. We will place 1 inch of nitroglycerin paste on the patient obtain labs chest x-ray and continue to closely monitor. On 6 L of oxygen the patient is satting 92%.  X-ray shows asymmetric edema versus right-sided pneumonia. Given the patient's hypoxia slight  elevation in white blood cell count low-grade temperature 99.2 we will send blood cultures and start on IV antibiotics. Patient continues to sat in the low 90s on 6 L. Patient will be admitted to the hospital for further workup and treatment. ____________________________________________   FINAL CLINICAL IMPRESSION(S) / ED DIAGNOSES  Dyspnea Hypoxia Pneumonia   Minna Antis, MD 04/03/16 1232

## 2016-04-03 NOTE — Progress Notes (Signed)
Pharmacist-Provider Communication  Adjusted dose of levofloxacin to  PO Q48hr based on patients renal function and being an HD patient.   Delsa Bern, PharmD 1:38 PM 04/03/2016

## 2016-04-03 NOTE — ED Triage Notes (Signed)
Pt dialysis patient, reports he had dialysis on Friday, was released Saturday and reports shortness of breath started today. Fiance reports pt has been drinking alcohol, smoking cigarettes and eating bad foods for him. Pt isn't able to start new dialysis until Tuesday.

## 2016-04-03 NOTE — Consult Note (Addendum)
PULMONARY / CRITICAL CARE MEDICINE   Name: Terry Riley MRN: [id number] DOB: 06-Mar-1955    ADMISSION DATE:  04/03/2016 CONSULTATION DATE: 04/03/16  REFERRING MD:  Winona Legato  CHIEF COMPLAINT:  Dyspnea, cough, hemoptysis, hypoxic respiratory failure  HISTORY OF PRESENT ILLNESS:   61 y.o. male with ESRD, HD dependent just released from prison 03/31 and went to [place] to celebrate. Came to ED on day of admission with above and with CXR consistent with pulmonary edema. C/O chest pain - upper sternal - exacerbated by cough. Noted to be very hypertensive on admission. Treated with NTG past and HD. CC presently is chest pain  PAST MEDICAL HISTORY :  He  has a past medical history of Hypertension and Renal disorder.  PAST SURGICAL HISTORY: He  has no past surgical history on file.  No Known Allergies  No current facility-administered medications on file prior to encounter.    No current outpatient prescriptions on file prior to encounter.    FAMILY HISTORY:  His has no family status information on file.    SOCIAL HISTORY: He  reports that he has been smoking Cigarettes.  He has been smoking about 0.50 packs per day. He has never used smokeless tobacco. He reports that he drinks alcohol.  REVIEW OF SYSTEMS:   As above. Otherwise, detailed ROS is noncontributory   SUBJECTIVE:    VITAL SIGNS: BP (!) 181/96   Pulse 94   Temp 98.5 F (36.9 C) (Oral)   Resp 20   Ht 6' (1.829 m)   Wt 160 lb 0.9 oz (72.6 kg)   SpO2 (!) 87%   BMI 21.71 kg/m   HEMODYNAMICS:    VENTILATOR SETTINGS:    INTAKE / OUTPUT: No intake/output data recorded.  PHYSICAL EXAMINATION: General: coughing some, speaks in full sentences, NAD Neuro: no focal deficits HEENT: NCAT, sclerae white, oropharynx norma Cardiovascular: severe JVD, reg, no M noted Lungs: diffuse coarse crackles Abdomen: soft, NT, +BS Ext: no edema, warm Skin: no lesions noted  LABS:  BMET  Recent Labs Lab  04/03/16 1133  NA 141  K 4.6  CL 102  CO2 26  BUN 56*  CREATININE 9.21*  GLUCOSE 93    Electrolytes  Recent Labs Lab 04/03/16 1133  CALCIUM 9.1    CBC  Recent Labs Lab 04/03/16 1133  WBC 11.4*  HGB 11.8*  HCT 35.7*  PLT 186    Coag's No results for input(s): APTT, INR in the last 168 hours.  Sepsis Markers No results for input(s): LATICACIDVEN, PROCALCITON, O2SATVEN in the last 168 hours.  ABG No results for input(s): PHART, PCO2ART, PO2ART in the last 168 hours.  Liver Enzymes  Recent Labs Lab 04/03/16 1133  AST 32  ALT 28  ALKPHOS 154*  BILITOT 0.7  ALBUMIN 4.0    Cardiac Enzymes  Recent Labs Lab 04/03/16 1133  TROPONINI 0.03*    Glucose  Recent Labs Lab 04/03/16 1431  GLUCAP 87    Imaging Dg Chest 2 View  Result Date: 04/03/2016 CLINICAL DATA:  Pt dialysis patient, reports he had dialysis on Friday, was released Saturday and reports shortness of breath started today. Fiance reports pt has been drinking alcohol, smoking cigarettes and eating bad foods for him. Pt isn't able to start new dialysis until Tuesday. Hx/o HT; smoker. EXAM: CHEST  2 VIEW COMPARISON:  None. FINDINGS: Cardiac silhouette is top-normal in size. No mediastinal or hilar masses. No convincing adenopathy. There are irregular interstitial opacities and hazy airspace opacity bilaterally, most  evident on the right. Thickening of the fissures is noted. The lungs are hyperexpanded. There is no pleural effusion. No pneumothorax. Skeletal structures are intact. IMPRESSION: 1. Interstitial and hazy airspace opacities most evident on the right. Pneumonia is suspected. Alternatively, this could reflect asymmetric pulmonary edema. Electronically Signed   By: Amie Portland M.D.   On: 04/03/2016 11:52   CXR: reviewed by me. Asymmetric bilateral pulmonary infiltrates c/w edema, less likely PNA EKG: no definite ischemia  ASSESSMENT / PLAN: ESRD Severe hypertension Smoker Acute hypoxic  respiratory failure Bilateral pulmonary infiltrates Suspect pulmonary edema Doubt PNA Mild hemoptysis due to above  Agree with NTG past and urgent HD Agree with empiric antibiotics PRN hydralazine ordered to maintain SBP < 160 mmHg Resume home anti-hypertensive regimen Echocardiogram ordered Recheck CXR in AM 04/02 If infiltrates clear with HD and mgmt of BP, would DC levofloxacin PRN morphine for CP ordered  Billy Fischer, MD PCCM service Mobile [phone] Pager [phone] 04/03/2016, 3:05 PM

## 2016-04-03 NOTE — Progress Notes (Signed)
Pre dialysis assessmentg

## 2016-04-03 NOTE — Plan of Care (Signed)
Problem: Education: Goal: Knowledge of disease and its progression will improve Outcome: Progressing Son Terry Riley in the room and attempting to give patient double cheese burger from fast food restaurant. Patient and son was educated on the dietary restrictions for dialysis patients. Son was educated on how to measure fluid intake so he could better comply with dietary regimen and or restriction.

## 2016-04-03 NOTE — Progress Notes (Signed)
Patient reports that he was diagnosed with renal failure while incarcerated and the diet was controlled by the prison. Patient and his son, Terry Riley who is at the bedside,  was educated on salt and fluid restrictions, and how to calculate all fluids using a 4oz grape juice cup as a baseline guide. Return demonstration allowed. Son Terry Riley and patient verbalized understanding of how to calculate fluid intake, however, further education on renal diet and limitations  are required for patient and his family to avoid complications.  Will inform provider. Will continue to monitor and endorse.

## 2016-04-03 NOTE — ED Notes (Signed)
Patient transported to X-ray 

## 2016-04-03 NOTE — ED Notes (Signed)
Pt placed on 4L via [place].

## 2016-04-04 ENCOUNTER — Inpatient Hospital Stay (HOSPITAL_COMMUNITY)
Admit: 2016-04-04 | Discharge: 2016-04-04 | Disposition: A | Discharge status: Home / Self Care | Payer: Medicaid Other | Attending: Internal Medicine | Admitting: Internal Medicine

## 2016-04-04 ENCOUNTER — Inpatient Hospital Stay: Admit: 2016-04-04 | Payer: Medicaid Other

## 2016-04-04 ENCOUNTER — Inpatient Hospital Stay: Payer: Medicaid Other

## 2016-04-04 ENCOUNTER — Encounter: Payer: Self-pay | Admitting: *Deleted

## 2016-04-04 DIAGNOSIS — I379 Nonrheumatic pulmonary valve disorder, unspecified: Secondary | ICD-10-CM

## 2016-04-04 DIAGNOSIS — J181 Lobar pneumonia, unspecified organism: Secondary | ICD-10-CM

## 2016-04-04 LAB — CBC
HEMATOCRIT: 32.4 % — AB (ref 40.0–52.0)
HEMOGLOBIN: 10.7 g/dL — AB (ref 13.0–18.0)
MCH: 28.4 pg (ref 26.0–34.0)
MCHC: 33 g/dL (ref 32.0–36.0)
MCV: 86.1 fL (ref 80.0–100.0)
Platelets: 155 10*3/uL (ref 150–440)
RBC: 3.76 MIL/uL — AB (ref 4.40–5.90)
RDW: 13.9 % (ref 11.5–14.5)
WBC: 9.8 10*3/uL (ref 3.8–10.6)

## 2016-04-04 LAB — BASIC METABOLIC PANEL
ANION GAP: 10 (ref 5–15)
BUN: 34 mg/dL — ABNORMAL HIGH (ref 6–20)
CO2: 31 mmol/L (ref 22–32)
Calcium: 8.7 mg/dL — ABNORMAL LOW (ref 8.9–10.3)
Chloride: 97 mmol/L — ABNORMAL LOW (ref 101–111)
Creatinine, Ser: 6.12 mg/dL — ABNORMAL HIGH (ref 0.61–1.24)
GFR calc non Af Amer: 9 mL/min — ABNORMAL LOW (ref >60)
GFR, EST AFRICAN AMERICAN: 10 mL/min — AB (ref >60)
Glucose, Bld: 110 mg/dL — ABNORMAL HIGH (ref 65–99)
POTASSIUM: 4.2 mmol/L (ref 3.5–5.1)
SODIUM: 138 mmol/L (ref 135–145)

## 2016-04-04 LAB — PROCALCITONIN: Procalcitonin: 1.51 ng/mL

## 2016-04-04 LAB — HEPATITIS B CORE ANTIBODY, TOTAL: Hep B Core Total Ab: POSITIVE — AB

## 2016-04-04 LAB — HEMOGLOBIN A1C
Hgb A1c MFr Bld: 4.7 % — ABNORMAL LOW (ref 4.8–5.6)
MEAN PLASMA GLUCOSE: 88 mg/dL

## 2016-04-04 LAB — INFLUENZA PANEL BY PCR (TYPE A & B)
INFLAPCR: NEGATIVE
INFLBPCR: NEGATIVE

## 2016-04-04 LAB — MAGNESIUM: MAGNESIUM: 1.8 mg/dL (ref 1.7–2.4)

## 2016-04-04 LAB — TROPONIN I: Troponin I: 0.06 ng/mL (ref <0.03)

## 2016-04-04 LAB — HIV ANTIBODY (ROUTINE TESTING W REFLEX): HIV SCREEN 4TH GENERATION: NONREACTIVE

## 2016-04-04 LAB — PARATHYROID HORMONE, INTACT (NO CA): PTH: 221 pg/mL — ABNORMAL HIGH (ref 15–65)

## 2016-04-04 LAB — PHOSPHORUS: PHOSPHORUS: 3 mg/dL (ref 2.5–4.6)

## 2016-04-04 LAB — HEPATITIS B SURFACE ANTIBODY,QUALITATIVE: Hep B S Ab: REACTIVE

## 2016-04-04 LAB — HEPATITIS B SURFACE ANTIGEN: HEP B S AG: NEGATIVE

## 2016-04-04 MED ORDER — LEVOFLOXACIN 500 MG PO TABS: 500.0000 mg | ORAL_TABLET | ORAL | Status: DC

## 2016-04-04 MED ORDER — LEVOFLOXACIN 500 MG PO TABS
500.0000 mg | ORAL_TABLET | ORAL | Status: DC
  Administered 2016-04-05: 500 mg via ORAL
  Filled 2016-04-04: qty 1

## 2016-04-04 MED ORDER — GUAIFENESIN-DM 100-10 MG/5ML PO SYRP
5.0000 mL | ORAL_SOLUTION | ORAL | Status: DC | PRN
  Administered 2016-04-04 – 2016-04-06 (×4): 5 mL via ORAL
  Filled 2016-04-04 (×5): qty 5

## 2016-04-04 NOTE — Progress Notes (Signed)
Start of hd 

## 2016-04-04 NOTE — Progress Notes (Signed)
[facility]  ROUNDING NOTE   Subjective:  Patient completed hemodialysis yesterday. His respiratory status has improved today. He still has some mild shortness of breath. Rales still on exam. We will plan for an additional dialysis treatment today.   Objective:  Vital signs in last 24 hours:  Temp:  [98.5 F (36.9 C)-102 F (38.9 C)] 101.8 F (38.8 C) (04/01 2100) Pulse Rate:  [75-119] 84 (04/02 0700) Resp:  [13-27] 14 (04/02 0700) BP: (123-201)/(70-112) 170/84 (04/02 0949) SpO2:  [77 %-96 %] 91 % (04/02 0700) Weight:  [72.6 kg (160 lb 0.9 oz)-77.1 kg (170 lb)] 74.8 kg (164 lb 14.5 oz) (04/01 1550)  Weight change:  Filed Weights   04/03/16 1113 04/03/16 1427 04/03/16 1550  Weight: 77.1 kg (170 lb) 72.6 kg (160 lb 0.9 oz) 74.8 kg (164 lb 14.5 oz)    Intake/Output: I/O last 3 completed shifts: In: 360 [P.O.:360] Out: 2500 [Other:2500]   Intake/Output this shift:  No intake/output data recorded.  Physical Exam: General: No acute distress  Head: Normocephalic, atraumatic. Moist oral mucosal membranes  Eyes: Anicteric  Neck: Supple, trachea midline  Lungs:  Bilateral rales, normal effort  Heart: S1S2 no rubs  Abdomen:  Soft, nontender  Extremities: Trace peripheral edema.  Neurologic: Nonfocal, moving all four extremities  Skin: No lesions       Basic Metabolic Panel:  Recent Labs Lab 04/03/16 1133 04/03/16 1600 04/04/16 0251  NA 141 140 138  K 4.6 4.1 4.2  CL 102 100* 97*  CO2 GLUCOSE 93 88 110*  BUN 56* 48* 34*  CREATININE 9.21* 7.73* 6.12*  CALCIUM 9.1 8.9 8.7*  MG  --   --  1.8  PHOS  --  3.5 3.0    Liver Function Tests:  Recent Labs Lab 04/03/16 1133 04/03/16 1600  AST 32 26  ALT 28 25  ALKPHOS 154* 143*  BILITOT 0.7 0.8  PROT 8.7* 8.2*  ALBUMIN 4.0 3.7   No results for input(s): LIPASE, AMYLASE in the last 168 hours. No results for input(s): AMMONIA in the last 168 hours.  CBC:  Recent Labs Lab  04/03/16 1133 04/04/16 0251  WBC 11.4* 9.8  HGB 11.8* 10.7*  HCT 35.7* 32.4*  MCV 86.7 86.1  PLT 186 155    Cardiac Enzymes:  Recent Labs Lab 04/03/16 1133 04/03/16 1433 04/03/16 2015 04/04/16 0251  TROPONINI 0.03* 0.05* 0.04* 0.06*    BNP: Invalid input(s): POCBNP  CBG:  Recent Labs Lab 04/03/16 1431  GLUCAP 87    Microbiology: Results for orders placed or performed during the hospital encounter of 04/03/16  Blood culture (routine x 2)     Status: None (Preliminary result)   Collection Time: 04/03/16 12:31 PM  Result Value Ref Range Status   Specimen Description BLOOD RIGHT FOREARM  Final   Special Requests   Final    BOTTLES DRAWN AEROBIC AND ANAEROBIC Blood Culture adequate volume   Culture NO GROWTH < 24 HOURS  Final   Report Status PENDING  Incomplete  Blood culture (routine x 2)     Status: None (Preliminary result)   Collection Time: 04/03/16  1:18 PM  Result Value Ref Range Status   Specimen Description BLOOD right forearm  Final   Special Requests   Final    BOTTLES DRAWN AEROBIC AND ANAEROBIC Blood Culture results may not be optimal due to an excessive volume of blood received in culture bottles   Culture NO GROWTH < 24 HOURS  Final   Report Status PENDING  Incomplete  MRSA PCR Screening     Status: None   Collection Time: 04/03/16  2:28 PM  Result Value Ref Range Status   MRSA by PCR NEGATIVE NEGATIVE Final    Comment:        The GeneXpert MRSA Assay (FDA approved for NASAL specimens only), is one component of a comprehensive MRSA colonization surveillance program. It is not intended to diagnose MRSA infection nor to guide or monitor treatment for MRSA infections.     Coagulation Studies: No results for input(s): LABPROT, INR in the last 72 hours.  Urinalysis: No results for input(s): COLORURINE, LABSPEC, PHURINE, GLUCOSEU, HGBUR, BILIRUBINUR, KETONESUR, PROTEINUR, UROBILINOGEN, NITRITE, LEUKOCYTESUR in the last 72 hours.  Invalid  input(s): APPERANCEUR    Imaging: Dg Chest 2 View  Result Date: 04/03/2016 CLINICAL DATA:  Pt dialysis patient, reports he had dialysis on Friday, was released Saturday and reports shortness of breath started today. Fiance reports pt has been drinking alcohol, smoking cigarettes and eating bad foods for him. Pt isn't able to start new dialysis until Tuesday. Hx/o HT; smoker. EXAM: CHEST  2 VIEW COMPARISON:  None. FINDINGS: Cardiac silhouette is top-normal in size. No mediastinal or hilar masses. No convincing adenopathy. There are irregular interstitial opacities and hazy airspace opacity bilaterally, most evident on the right. Thickening of the fissures is noted. The lungs are hyperexpanded. There is no pleural effusion. No pneumothorax. Skeletal structures are intact. IMPRESSION: 1. Interstitial and hazy airspace opacities most evident on the right. Pneumonia is suspected. Alternatively, this could reflect asymmetric pulmonary edema. Electronically Signed   By: Amie Portland M.D.   On: 04/03/2016 11:52   Dg Chest Port 1 View  Result Date: 04/04/2016 CLINICAL DATA:  Pulmonary edema. EXAM: PORTABLE CHEST 1 VIEW COMPARISON:  04/03/2016. FINDINGS: Cardiomegaly. Persistent but slightly improved bilateral pulmonary infiltrates, right side greater than left, noted. Findings suggest improving pulmonary edema. Bilateral pneumonia cannot be excluded. No pleural effusion or pneumothorax. IMPRESSION: Cardiomegaly with persistent but slightly improved bilateral pulmonary infiltrates, right side greater than left. Findings consistent with improving pulmonary edema. Electronically Signed   By: Maisie Fus  Register   On: 04/04/2016 06:38     Medications:    . amLODipine  10 mg Oral Daily  . aspirin EC  81 mg Oral Daily  . hydrALAZINE  50 mg Oral TID  . [START ON 04/05/2016] levofloxacin  500 mg Oral Q48H  . losartan  100 mg Oral Daily  . metoprolol  100 mg Oral BID  . nitroGLYCERIN  1 inch Topical Q6H  . sodium  chloride flush  3 mL Intravenous Q12H  . sodium chloride flush  3 mL Intravenous Q12H   sodium chloride, acetaminophen **OR** acetaminophen, guaiFENesin-dextromethorphan, hydrALAZINE, HYDROcodone-acetaminophen, morphine injection, ondansetron **OR** ondansetron (ZOFRAN) IV, sodium chloride flush  Assessment/ Plan:  61 y.o. male with End Stage Renal Disease on hemodialysis since 09/2014 with left arm AVG, hypertension, diabetes mellitus type II, cholecystectomy, who was admitted to [facility] on 04/03/2016   TTS [facility] [place] Left Arm AVG  1. End Stage Renal Disease on hemodialysis: with pulmonary edema and accelerated hypertension.  - Patient had urgent dialysis yesterday. His respiratory status is significantly improved. He still has rales on exam. We will plan for another dialysis session today.  2. Hypertension: Blood pressure currently 170/84.  Continue amlodipine, hydralazine, losartan, and metoprolol.  3. Anemia of chronic kidney disease:Continue to hold Epogen for now.   4. Secondary Hyperparathyroidism:  Phosphorus currently 3.0 and acceptable. Patient not on binders therapy at the moment.   LOS: 1 Britny Riel 4/2/201810:21 AM

## 2016-04-04 NOTE — Progress Notes (Addendum)
[facility] [place] [facility]   PATIENT NAME: Terry Riley    MR#:  [id number]  DATE OF BIRTH:  07-10-1955  SUBJECTIVE:  CHIEF COMPLAINT:   Chief Complaint  Patient presents with  . Shortness of Breath   Better cough and shortness of breath, on O2 [place] 3L. REVIEW OF SYSTEMS:  Review of Systems  Constitutional: Positive for malaise/fatigue. Negative for chills and fever.  HENT: Negative for congestion.   Eyes: Negative for blurred vision and double vision.  Respiratory: Positive for cough and shortness of breath. Negative for hemoptysis, sputum production, wheezing and stridor.   Cardiovascular: Negative for chest pain, palpitations and leg swelling.  Gastrointestinal: Negative for abdominal pain, blood in stool, diarrhea, melena, nausea and vomiting.  Genitourinary: Negative for dysuria and hematuria.  Musculoskeletal: Negative for back pain.  Skin: Negative for itching and rash.  Neurological: Positive for weakness.  Psychiatric/Behavioral: Negative for depression.    DRUG ALLERGIES:  No Known Allergies VITALS:  Blood pressure 111/75, pulse 70, temperature 98.3 F (36.8 C), temperature source Oral, resp. rate 11, height 6' (1.829 m), weight 160 lb 4.4 oz (72.7 kg), SpO2 91 %. PHYSICAL EXAMINATION:  Physical Exam  Constitutional: He is oriented to person, place, and time and well-developed, well-nourished, and in no distress.  HENT:  Mouth/Throat: Oropharynx is clear and moist.  Eyes: Conjunctivae and EOM are normal.  Neck: Normal range of motion. Neck supple. No JVD present. No tracheal deviation present.  Cardiovascular: Normal rate, regular rhythm and normal heart sounds.  Exam reveals no gallop.   No murmur heard. Pulmonary/Chest: Effort normal. No respiratory distress. He has no wheezes. He has rales.  Bilateral rales.  Abdominal: Soft. Bowel sounds are normal. He exhibits no distension. There is no tenderness.  Musculoskeletal: Normal range  of motion. He exhibits no edema or tenderness.  Neurological: He is alert and oriented to person, place, and time. No cranial nerve deficit.  Skin: No rash noted. No erythema.  Psychiatric: Affect and judgment normal.   LABORATORY PANEL:  Male CBC  Recent Labs Lab 04/04/16 0251  WBC 9.8  HGB 10.7*  HCT 32.4*  PLT 155   ------------------------------------------------------------------------------------------------------------------ Chemistries   Recent Labs Lab 04/03/16 1600 04/04/16 0251  NA 140 138  K 4.1 4.2  CL 100* 97*  CO2 26 31  GLUCOSE 88 110*  BUN 48* 34*  CREATININE 7.73* 6.12*  CALCIUM 8.9 8.7*  MG  --  1.8  AST 26  --   ALT 25  --   ALKPHOS 143*  --   BILITOT 0.8  --    RADIOLOGY:  Dg Chest Port 1 View  Result Date: 04/04/2016 CLINICAL DATA:  Pulmonary edema. EXAM: PORTABLE CHEST 1 VIEW COMPARISON:  04/03/2016. FINDINGS: Cardiomegaly. Persistent but slightly improved bilateral pulmonary infiltrates, right side greater than left, noted. Findings suggest improving pulmonary edema. Bilateral pneumonia cannot be excluded. No pleural effusion or pneumothorax. IMPRESSION: Cardiomegaly with persistent but slightly improved bilateral pulmonary infiltrates, right side greater than left. Findings consistent with improving pulmonary edema. Electronically Signed   By: Maisie Fus  Register   On: 04/04/2016 06:38   ASSESSMENT AND PLAN:   #1. Acute respiratory failure with hypoxia due to acute pulmonary edema, questionable pneumonia,  He got hemodialysis emergently,  Try to wean off O2 [place].  chest x-ray shows better pulmonary edema.  Continue levofloxacin orally.  #2. Acute pulmonary edema due to fluid overload. Follow-up echocardiogram, continue nitroglycerin topically  #3. Malignant essential hypertension,  blood pressure is controlled. resumed all outpatient medications, on nitroglycerin topically.  #4. Elevated troponin, due to demanding ischemia. continue  metoprolol, aspirin.  #5. Leukocytosis, improved.  #6. Tobacco abuse. Counseling, discussed this patient for 4 minutes, nicotine replacement therapy. I discussed with Dr. Linward Natal. All the records are reviewed and case discussed with Care Management/Social Worker. Management plans discussed with the patient, His mother and they are in agreement.  CODE STATUS: Full Code  TOTAL TIME TAKING CARE OF THIS PATIENT: 38 minutes.   More than 50% of the time was spent in counseling/coordination of care: YES  POSSIBLE D/C IN 3 DAYS, DEPENDING ON CLINICAL CONDITION.   Shaune Pollack M.D on 04/04/2016 at 1:48 PM  Between 7am to 6pm - Pager - [phone]  After 6pm go to [email or web address] - Social research officer, government  [facility] [place] [facility]  Office  [phone]  CC: Primary care physician; Pcp Not In System  Note: This dictation was prepared with Dragon dictation along with smaller phrase technology. Any transcriptional errors that result from this process are unintentional.

## 2016-04-04 NOTE — Progress Notes (Signed)
This note also relates to the following rows which could not be included: Resp - Cannot attach notes to unvalidated device data  Pre hd info  

## 2016-04-04 NOTE — Progress Notes (Signed)
Post hd vitals 

## 2016-04-04 NOTE — Care Management (Signed)
Spoke with Marchelle Folks with [facility] and informed that patient is actually a "new start" at [facility] in [place]. Today (4/2) was to have been his first outpatient visit.   Marchelle Folks relays that patient does have medicaid.  Patient is currently not available to discuss this further.  Have reached out to [facility] to investigate.

## 2016-04-04 NOTE — Care Management (Signed)
Patient is followed by [facility] in [place] [facility]. In [place] to celebrate release from prison. Admitted with pulmonary edema and requiring acute oxygen support. Notified Dimas Chyle with [facility].  He does not reside in [place]

## 2016-04-04 NOTE — Progress Notes (Signed)
Post hd assessment 

## 2016-04-04 NOTE — Progress Notes (Addendum)
Pharmacy Antibiotic Note  Terry Riley is a 61 y.o. male admitted on 04/03/2016 with  pneumonia.  Pharmacy has been consulted for levofloxacin  dosing.  Plan: Patient received levofloxacin  x 1 dose. Will continue levofloxacin  every 48 hours based on patients renal function.   Height: 6' (182.9 cm) Weight: 160 lb 4.4 oz (72.7 kg) IBW/kg (Calculated) : 77.6  Temp (24hrs), Avg:99.7 F (37.6 C), Min:98.3 F (36.8 C), Max:102 F (38.9 C)   Recent Labs Lab 04/03/16 1133 04/03/16 1600 04/04/16 0251  WBC 11.4*  --  9.8  CREATININE 9.21* 7.73* 6.12*    Estimated Creatinine Clearance: 13.4 mL/min (A) (by C-G formula based on SCr of 6.12 mg/dL (H)).    No Known Allergies  Antimicrobials this admission: 4/1 levofloxacin  >>   Dose adjustments this admission:  Microbiology results: 4/1 BCx: NG x 24 hours 4/1 UCx: sent  4/1 MRSA PCR: negative   Thank you for allowing pharmacy to be a part of this patient's care.  Gardner Candle, PharmD, BCPS Clinical Pharmacist 04/04/2016 2:10 PM

## 2016-04-04 NOTE — Progress Notes (Signed)
  [facility] Critical Care Medicine Progess Note    SYNOPSIS   61 y.o. male with ESRD, HD dependent just released from prison 03/31 and went to [employer] to celebrate. Came to ED on day of admission with CXR consistent with pulmonary edema and ? Pneumonia.   ASSESSMENT/PLAN    PULMONARY A:Acute hypoxic retrospect failure with pulmonary edema, pneumonia. -Review chest x-ray images 4/2; improved from her edema, continued right sided pneumonia changes. -Influenza negative. -Currently on nasal cannula 6 L. P:   -Continue antibiotics. -Continue hemodialysis for fluid removal. -Management of hypertension.  CARDIOVASCULAR A: Essential hypertension, likely contributing to pulmonary edema. P:  Norvasc 10 mg daily., Losartan 100 mg by mouth daily. Metoprolol 100 mg by mouth twice a day. HEMODYNAMICS:  --  RENAL A:  End-stage renal disease P:   Hemodialysis per nephrology. INTAKE / OUTPUT:  Intake/Output Summary (Last 24 hours) at 04/04/16 0746 Last data filed at 04/03/16 2100  Gross per 24 hour  Intake              360 ml  Output             2500 ml  Net            -2140 ml     INFECTIOUS A:  Pneumonia. P:    Micro/culture results:  BCx2 4/1: Negative. MRSA PCR 4/1: Negative Influenza PCR 4/2:  Antibiotics: Levaquin 4/3>>   MAJOR EVENTS/TEST RESULTS:   Best Practices  DVT Prophylaxis: heparin GI Prophylaxis: --   ---------------------------------------   ----------------------------------------   Name: Terry Riley MRN: [id number] DOB: [birth year]    ADMISSION DATE:  04/03/2016    SUBJECTIVE:  Patient feeling well, no new complaints today.  Review of Systems:  Constitutional: Feels well. Cardiovascular: No chest pain.  Pulmonary: Denies dyspnea.   The remainder of systems were reviewed and were found to be negative other than what is documented in the HPI.    VITAL SIGNS: Temp:  [98.5 F (36.9 C)-102 F (38.9 C)] 101.8 F (38.8 C)  (04/01 2100) Pulse Rate:  [75-119] 84 (04/02 0700) Resp:  [13-27] 14 (04/02 0700) BP: (123-201)/(70-112) 163/90 (04/02 0700) SpO2:  [77 %-96 %] 91 % (04/02 0700) Weight:  [160 lb 0.9 oz (72.6 kg)-170 lb (77.1 kg)] 164 lb 14.5 oz (74.8 kg) (04/01 1550)     PHYSICAL EXAMINATION: Physical Examination:   VS: BP (!) 163/90   Pulse 84   Temp (!) 101.8 F (38.8 C) (Axillary)   Resp 14   Ht 6' (1.829 m)   Wt 164 lb 14.5 oz (74.8 kg)   SpO2 91%   BMI 22.37 kg/m   General Appearance: No distress  Neuro:without focal findings, mental status normal. HEENT: PERRLA, EOM intact. Pulmonary: Decreased air entry with crackles in the right lung. CardiovascularNormal S1,S2.  No m/r/g.   Abdomen: Benign, Soft, non-tender. Renal:  No costovertebral tenderness  GU:  Not performed at this time. Endocrine: No evident thyromegaly. Skin:   warm, no rashes, no ecchymosis  Extremities: normal, no cyanosis, clubbing.     Wells Guiles, MD.  ICU Pager: [phone] [facility] Office Number: [phone]   04/04/2016

## 2016-04-04 NOTE — Clinical Social Work Note (Signed)
CSW received consult that patient needs a primary care physician and a nephrologist, case management is assisting patient with this.  CSW to sign off please reconsult if social work needs arise.  Marielis Samara R. AntErvin Knackaus, MSW, Theresia Majors [phone]  04/04/2016 3:21 PM

## 2016-04-04 NOTE — Progress Notes (Signed)
Report called to Cicero Duck, RN on 2-C. Call received from [place], [place] in HD. She has available spot for patient in HD at this time. Orderly coming to get this patient now. Erika on 2-C was called and advised that this patient would be going to HD, then coming to room 221.

## 2016-04-04 NOTE — Progress Notes (Signed)
Pre hd assessment  

## 2016-04-04 NOTE — Progress Notes (Signed)
  End of hd 

## 2016-04-04 NOTE — Progress Notes (Signed)
Pharmacy Antibiotic Note  Terry Riley is a 61 y.o. male admitted on 04/03/2016 with pneumonia/pulmonary edema/SOB.  Pharmacy has been consulted for Levaquin dosing.  Plan: Patient received Levaquin 750 mg IV x 1   Will re-adjust dose to levaquin 500 mg q48h to start 4/3 @ 1300 based on severity of symptoms. Patient is on dialysis next scheduled is on Tuesday.  Height: 6' (182.9 cm) Weight: 164 lb 14.5 oz (74.8 kg) IBW/kg (Calculated) : 77.6  Temp (24hrs), Avg:99.9 F (37.7 C), Min:98.5 F (36.9 C), Max:102 F (38.9 C)   Recent Labs Lab 04/03/16 1133 04/03/16 1600  WBC 11.4*  --   CREATININE 9.21* 7.73*    Estimated Creatinine Clearance: 10.9 mL/min (A) (by C-G formula based on SCr of 7.73 mg/dL (H)).    No Known Allergies   Thank you for allowing pharmacy to be a part of this patient's care.  Thomasene Ripple, PharmD, BCPS Clinical Pharmacist 04/04/2016

## 2016-04-05 LAB — ECHOCARDIOGRAM COMPLETE
E decel time: 211 msec
E/e' ratio: 6.47
FS: 29 % (ref 28–44)
HEIGHTINCHES: 72 in
IVS/LV PW RATIO, ED: 0.81
LA vol A4C: 79.5 ml
LADIAMINDEX: 2.79 cm/m2
LASIZE: 53 mm
LAVOL: 102 mL
LAVOLIN: 53.7 mL/m2
LEFT ATRIUM END SYS DIAM: 53 mm
LV E/e' medial: 6.47
LV PW d: 11.2 mm — AB (ref 0.6–1.1)
LV TDI E'MEDIAL: 6.48
LVEEAVG: 6.47
LVELAT: 13.2 cm/s
Lateral S' vel: 16 cm/s
MV Dec: 211
MV Peak grad: 3 mmHg
MV pk A vel: 53.8 m/s
MV pk E vel: 85.4 m/s
TAPSE: 25.8 mm
TDI e' lateral: 13.2
WEIGHTICAEL: 2522.06 [oz_av]

## 2016-04-05 LAB — PHOSPHORUS
PHOSPHORUS: 3.4 mg/dL (ref 2.5–4.6)
PHOSPHORUS: 3.4 mg/dL (ref 2.5–4.6)

## 2016-04-05 LAB — CBC
HCT: 30.9 % — ABNORMAL LOW (ref 40.0–52.0)
Hemoglobin: 10.2 g/dL — ABNORMAL LOW (ref 13.0–18.0)
MCH: 28.3 pg (ref 26.0–34.0)
MCHC: 33 g/dL (ref 32.0–36.0)
MCV: 85.7 fL (ref 80.0–100.0)
PLATELETS: 150 10*3/uL (ref 150–440)
RBC: 3.6 MIL/uL — ABNORMAL LOW (ref 4.40–5.90)
RDW: 13.7 % (ref 11.5–14.5)
WBC: 8.6 10*3/uL (ref 3.8–10.6)

## 2016-04-05 LAB — BASIC METABOLIC PANEL
ANION GAP: 9 (ref 5–15)
BUN: 46 mg/dL — AB (ref 6–20)
CALCIUM: 9 mg/dL (ref 8.9–10.3)
CO2: 32 mmol/L (ref 22–32)
Chloride: 98 mmol/L — ABNORMAL LOW (ref 101–111)
Creatinine, Ser: 5.97 mg/dL — ABNORMAL HIGH (ref 0.61–1.24)
GFR calc Af Amer: 11 mL/min — ABNORMAL LOW (ref >60)
GFR, EST NON AFRICAN AMERICAN: 9 mL/min — AB (ref >60)
GLUCOSE: 79 mg/dL (ref 65–99)
Potassium: 4.2 mmol/L (ref 3.5–5.1)
SODIUM: 139 mmol/L (ref 135–145)

## 2016-04-05 LAB — PROCALCITONIN: Procalcitonin: 1.63 ng/mL

## 2016-04-05 MED ORDER — HYDRALAZINE HCL 50 MG PO TABS
75.0000 mg | ORAL_TABLET | Freq: Three times a day (TID) | ORAL | Status: DC
  Administered 2016-04-05 – 2016-04-06 (×3): 75 mg via ORAL
  Filled 2016-04-05 (×3): qty 1

## 2016-04-05 NOTE — Care Management (Signed)
Medicaid coverage has been added by the [facility]

## 2016-04-05 NOTE — Progress Notes (Signed)
[facility] [place] [facility]   PATIENT NAME: Terry Riley    MR#:  [id number]  DATE OF BIRTH:  Aug 23, 1955  SUBJECTIVE:  CHIEF COMPLAINT:   Chief Complaint  Patient presents with  . Shortness of Breath   Still cough and shortness of breath, on O2 [place] 2 L. REVIEW OF SYSTEMS:  Review of Systems  Constitutional: Positive for malaise/fatigue. Negative for chills and fever.  HENT: Negative for congestion.   Eyes: Negative for blurred vision and double vision.  Respiratory: Positive for cough and shortness of breath. Negative for hemoptysis, sputum production, wheezing and stridor.   Cardiovascular: Negative for chest pain, palpitations and leg swelling.  Gastrointestinal: Negative for abdominal pain, blood in stool, diarrhea, melena, nausea and vomiting.  Genitourinary: Negative for dysuria and hematuria.  Musculoskeletal: Negative for back pain.  Skin: Negative for itching and rash.  Neurological: Positive for weakness.  Psychiatric/Behavioral: Negative for depression.    DRUG ALLERGIES:  No Known Allergies VITALS:  Blood pressure (!) 179/89, pulse 79, temperature 98.5 F (36.9 C), temperature source Oral, resp. rate 18, height 6' (1.829 m), weight 154 lb (69.9 kg), SpO2 94 %. PHYSICAL EXAMINATION:  Physical Exam  Constitutional: He is oriented to person, place, and time and well-developed, well-nourished, and in no distress.  HENT:  Mouth/Throat: Oropharynx is clear and moist.  Eyes: Conjunctivae and EOM are normal.  Neck: Normal range of motion. Neck supple. No JVD present. No tracheal deviation present.  Cardiovascular: Normal rate, regular rhythm and normal heart sounds.  Exam reveals no gallop.   No murmur heard. Pulmonary/Chest: Effort normal and breath sounds normal.  Bilateral basilar rales  Abdominal: Soft. Bowel sounds are normal. He exhibits no distension. There is no tenderness.  Musculoskeletal: Normal range of motion. He exhibits no  edema or tenderness.  Neurological: He is alert and oriented to person, place, and time. No cranial nerve deficit.  Skin: No rash noted. No erythema.  Psychiatric: Affect and judgment normal.   LABORATORY PANEL:  Male CBC  Recent Labs Lab 04/05/16 0408  WBC 8.6  HGB 10.2*  HCT 30.9*  PLT 150   ------------------------------------------------------------------------------------------------------------------ Chemistries   Recent Labs Lab 04/03/16 1600 04/04/16 0251 04/05/16 0408  NA 140 138 139  K 4.1 4.2 4.2  CL 100* 97* 98*  CO2 26 31 32  GLUCOSE 88 110* 79  BUN 48* 34* 46*  CREATININE 7.73* 6.12* 5.97*  CALCIUM 8.9 8.7* 9.0  MG  --  1.8  --   AST 26  --   --   ALT 25  --   --   ALKPHOS 143*  --   --   BILITOT 0.8  --   --    RADIOLOGY:  No results found. ASSESSMENT AND PLAN:   #1. Acute respiratory failure with hypoxia due to acute pulmonary edema and questionable pneumonia. He got hemodialysis emergently,  Try to wean off O2 [place].  chest x-ray shows better pulmonary edema.  continue levofloxacin orally.   #2. Acute pulmonary edema due to fluid overload. Follow-up echocardiogram, continue nitroglycerin topically, continue hemodialysis.  #3. Malignant essential hypertension, blood pressure is high. resumed all outpatient medications, on nitroglycerin topically. IV hydralazine when necessary.  #4. Elevated troponin, due to demanding ischemia. continue metoprolol, aspirin.  #5. Leukocytosis, improved.  #6. Tobacco abuse. Counseling, discussed this patient for 4 minutes, nicotine replacement therapy.  All the records are reviewed and case discussed with Care Management/Social Worker. Management plans discussed with the  patient, His mother and they are in agreement.  CODE STATUS: Full Code  TOTAL TIME TAKING CARE OF THIS PATIENT: 33 minutes.   More than 50% of the time was spent in counseling/coordination of care: YES  POSSIBLE D/C IN 2 DAYS, DEPENDING  ON CLINICAL CONDITION.   Shaune Pollack M.D on 04/05/2016 at 3:26 PM  Between 7am to 6pm - Pager - [phone]  After 6pm go to [email or web address] - Social research officer, government  [facility] [place] [facility]  Office  [phone]  CC: Primary care physician; Pcp Not In System  Note: This dictation was prepared with Dragon dictation along with smaller phrase technology. Any transcriptional errors that result from this process are unintentional.

## 2016-04-05 NOTE — Plan of Care (Signed)
Problem: Food- and Nutrition-Related Knowledge Deficit (NB-1.1) Goal: Nutrition education Formal process to instruct or train a patient/client in a skill or to impart knowledge to help patients/clients voluntarily manage or modify food choices and eating behavior to maintain or improve health. Outcome: Completed/Met Date Met: 04/05/16 Nutrition Education Note  RD consulted for Renal Education. Provided Choose-A-Meal Booklet to patient/family. Reviewed food groups and provided written recommended serving sizes specifically determined for patient's current nutritional status.   Explained why diet restrictions are needed and provided lists of foods to limit/avoid that are high potassium, sodium, and phosphorus. Provided specific recommendations on safer alternatives of these foods. Strongly encouraged compliance of this diet.   Discussed importance of protein intake at each meal and snack. Provided examples of how to maximize protein intake throughout the day. Discussed need for fluid restriction with dialysis, importance of minimizing weight gain between HD treatments, and renal-friendly beverage options.  Encouraged pt to discuss specific diet questions/concerns with RD at HD outpatient facility. Teach back method used.  Expect fair compliance.  Body mass index is 20.89 kg/m. Pt meets criteria for normal based on current BMI.  Current diet order is renal with fluid restriction , patient is consuming approximately 100% of meals at this time. Labs and medications reviewed. No further nutrition interventions warranted at this time. RD contact information provided. If additional nutrition issues arise, please re-consult RD.  Satira Anis. Remona Boom, MS, RD LDN Inpatient Clinical Dietitian Pager [phone]

## 2016-04-05 NOTE — Progress Notes (Signed)
Post HD  

## 2016-04-05 NOTE — Progress Notes (Signed)
HD tx ended 

## 2016-04-05 NOTE — Progress Notes (Signed)
[facility]  ROUNDING NOTE   Subjective:  Patient seen and evaluated during hemodialysis today. His shortness of breath has significantly improved since admission.  Objective:  Vital signs in last 24 hours:  Temp:  [97.7 F (36.5 C)-98.8 F (37.1 C)] 98.3 F (36.8 C) (04/03 1004) Pulse Rate:  [64-92] 66 (04/03 1100) Resp:  [9-17] 16 (04/03 1100) BP: (111-172)/(71-100) 153/73 (04/03 1100) SpO2:  [85 %-99 %] 96 % (04/03 1100) Weight:  [69.9 kg (154 lb)-72.7 kg (160 lb 4.4 oz)] 69.9 kg (154 lb) (04/03 1004)  Weight change: -4.412 kg (-9 lb 11.6 oz) Filed Weights   04/04/16 1245 04/04/16 1540 04/05/16 1004  Weight: 72.7 kg (160 lb 4.4 oz) 71.5 kg (157 lb 10.1 oz) 69.9 kg (154 lb)    Intake/Output: I/O last 3 completed shifts: In: 603 [P.O.:600; I.V.:3] Out: 4100 [Other:4100]   Intake/Output this shift:  Total I/O In: 240 [P.O.:240] Out: -   Physical Exam: General: No acute distress  Head: Normocephalic, atraumatic. Moist oral mucosal membranes  Eyes: Anicteric  Neck: Supple, trachea midline  Lungs:  Bilateral rales, normal effort  Heart: S1S2 no rubs  Abdomen:  Soft, nontender  Extremities: Trace peripheral edema.  Neurologic: Nonfocal, moving all four extremities  Skin: No lesions       Basic Metabolic Panel:  Recent Labs Lab 04/03/16 1133 04/03/16 1600 04/04/16 0251 04/05/16 0408  NA 141 140 138 139  K 4.6 4.1 4.2 4.2  CL 102 100* 97* 98*  CO2 32  GLUCOSE 93 88 110* 79  BUN 56* 48* 34* 46*  CREATININE 9.21* 7.73* 6.12* 5.97*  CALCIUM 9.1 8.9 8.7* 9.0  MG  --   --  1.8  --   PHOS  --  3.5 3.0 3.4    Liver Function Tests:  Recent Labs Lab 04/03/16 1133 04/03/16 1600  AST 32 26  ALT 28 25  ALKPHOS 154* 143*  BILITOT 0.7 0.8  PROT 8.7* 8.2*  ALBUMIN 4.0 3.7   No results for input(s): LIPASE, AMYLASE in the last 168 hours. No results for input(s): AMMONIA in the last 168 hours.  CBC:  Recent Labs Lab  04/03/16 1133 04/04/16 0251 04/05/16 0408  WBC 11.4* 9.8 8.6  HGB 11.8* 10.7* 10.2*  HCT 35.7* 32.4* 30.9*  MCV 86.7 86.1 85.7  PLT 186 155 150    Cardiac Enzymes:  Recent Labs Lab 04/03/16 1133 04/03/16 1433 04/03/16 2015 04/04/16 0251  TROPONINI 0.03* 0.05* 0.04* 0.06*    BNP: Invalid input(s): POCBNP  CBG:  Recent Labs Lab 04/03/16 1431  GLUCAP 87    Microbiology: Results for orders placed or performed during the hospital encounter of 04/03/16  Blood culture (routine x 2)     Status: None (Preliminary result)   Collection Time: 04/03/16 12:31 PM  Result Value Ref Range Status   Specimen Description BLOOD RIGHT FOREARM  Final   Special Requests   Final    BOTTLES DRAWN AEROBIC AND ANAEROBIC Blood Culture adequate volume   Culture NO GROWTH 2 DAYS  Final   Report Status PENDING  Incomplete  Blood culture (routine x 2)     Status: None (Preliminary result)   Collection Time: 04/03/16  1:18 PM  Result Value Ref Range Status   Specimen Description BLOOD right forearm  Final   Special Requests   Final    BOTTLES DRAWN AEROBIC AND ANAEROBIC Blood Culture results may not be optimal due to an excessive volume of blood  received in culture bottles   Culture NO GROWTH 2 DAYS  Final   Report Status PENDING  Incomplete  MRSA PCR Screening     Status: None   Collection Time: 04/03/16  2:28 PM  Result Value Ref Range Status   MRSA by PCR NEGATIVE NEGATIVE Final    Comment:        The GeneXpert MRSA Assay (FDA approved for NASAL specimens only), is one component of a comprehensive MRSA colonization surveillance program. It is not intended to diagnose MRSA infection nor to guide or monitor treatment for MRSA infections.     Coagulation Studies: No results for input(s): LABPROT, INR in the last 72 hours.  Urinalysis: No results for input(s): COLORURINE, LABSPEC, PHURINE, GLUCOSEU, HGBUR, BILIRUBINUR, KETONESUR, PROTEINUR, UROBILINOGEN, NITRITE, LEUKOCYTESUR in  the last 72 hours.  Invalid input(s): APPERANCEUR    Imaging: Dg Chest 2 View  Result Date: 04/03/2016 CLINICAL DATA:  Pt dialysis patient, reports he had dialysis on Friday, was released Saturday and reports shortness of breath started today. Fiance reports pt has been drinking alcohol, smoking cigarettes and eating bad foods for him. Pt isn't able to start new dialysis until Tuesday. Hx/o HT; smoker. EXAM: CHEST  2 VIEW COMPARISON:  None. FINDINGS: Cardiac silhouette is top-normal in size. No mediastinal or hilar masses. No convincing adenopathy. There are irregular interstitial opacities and hazy airspace opacity bilaterally, most evident on the right. Thickening of the fissures is noted. The lungs are hyperexpanded. There is no pleural effusion. No pneumothorax. Skeletal structures are intact. IMPRESSION: 1. Interstitial and hazy airspace opacities most evident on the right. Pneumonia is suspected. Alternatively, this could reflect asymmetric pulmonary edema. Electronically Signed   By: Amie Portland M.D.   On: 04/03/2016 11:52   Dg Chest Port 1 View  Result Date: 04/04/2016 CLINICAL DATA:  Pulmonary edema. EXAM: PORTABLE CHEST 1 VIEW COMPARISON:  04/03/2016. FINDINGS: Cardiomegaly. Persistent but slightly improved bilateral pulmonary infiltrates, right side greater than left, noted. Findings suggest improving pulmonary edema. Bilateral pneumonia cannot be excluded. No pleural effusion or pneumothorax. IMPRESSION: Cardiomegaly with persistent but slightly improved bilateral pulmonary infiltrates, right side greater than left. Findings consistent with improving pulmonary edema. Electronically Signed   By: Maisie Fus  Register   On: 04/04/2016 06:38     Medications:    . amLODipine  10 mg Oral Daily  . aspirin EC  81 mg Oral Daily  . hydrALAZINE  50 mg Oral TID  . levofloxacin  500 mg Oral Q48H  . losartan  100 mg Oral Daily  . metoprolol  100 mg Oral BID  . nitroGLYCERIN  1 inch Topical Q6H  .  sodium chloride flush  3 mL Intravenous Q12H  . sodium chloride flush  3 mL Intravenous Q12H   sodium chloride, acetaminophen **OR** acetaminophen, guaiFENesin-dextromethorphan, hydrALAZINE, HYDROcodone-acetaminophen, morphine injection, ondansetron **OR** ondansetron (ZOFRAN) IV, sodium chloride flush  Assessment/ Plan:  61 y.o. male with End Stage Renal Disease on hemodialysis since 09/2014 with left arm AVG, hypertension, diabetes mellitus type II, cholecystectomy, who was admitted to [facility] on 04/03/2016   TTS [facility] Left Arm AVG  1. End Stage Renal Disease on hemodialysis: with pulmonary edema and accelerated hypertension.  - Patient improved with consecutive days of dialysis. We evaluated patient during dialysis today. Complete dialysis treatment today.  2. Hypertension: Blood pressure today is 153/73.  Continue amlodipine, hydralazine, losartan, and metoprolol.  3. Anemia of chronic kidney disease:Continue to hold Epogen for now, hemoglobin 10.2.  4. Secondary Hyperparathyroidism:  Phosphorus 3.4 and acceptable.  Continue to monitor bone mineral metabolism parameters.    LOS: 2 Terry Riley 4/3/201811:45 AM

## 2016-04-05 NOTE — Progress Notes (Signed)
[facility]  SUBJECTIVE: Patient reports that he feels better this morning, breathing better. He has chest discomfort with coughing.    Vitals:   04/04/16 2108 04/04/16 2200 04/04/16 2203 04/05/16 0520  BP: (!) 168/86   (!) 156/85  Pulse: 92   70  Resp:   16   Temp: 98.8 F (37.1 C)   98.4 F (36.9 C)  TempSrc: Oral   Oral  SpO2: (!) 87% (!) 88%  97%  Weight:      Height:         Intake/Output Summary (Last 24 hours) at 04/05/16 1610 Last data filed at 04/04/16 2201  Gross per 24 hour  Intake              243 ml  Output             1600 ml  Net            -1357 ml      PHYSICAL EXAM  General: Iin no acute distress HEENT:  Normocephalic and atramatic Neck:  No JVD.  Lungs: Normal effort of breathing on nasal cannula. Bibasilar crackles Heart: HRRR . Normal S1 and S2 without gallops or murmurs.  Abdomen: Bowel sounds are positive, abdomen soft and non-tender  Msk:  Back normal, able to sit upright in bed without assistance Extremities: No clubbing, cyanosis or edema.   Neuro: Alert and oriented X 3. Psych:  Good affect, responds appropriately   LABS: Basic Metabolic Panel:  Recent Labs  96/04/54 0251 04/05/16 0408  NA 138 139  K 4.2 4.2  CL 97* 98*  CO2 31 32  GLUCOSE 110* 79  BUN 34* 46*  CREATININE 6.12* 5.97*  CALCIUM 8.7* 9.0  MG 1.8  --   PHOS 3.0 3.4   Liver Function Tests:  Recent Labs  04/03/16 1133 04/03/16 1600  AST 32 26  ALT 28 25  ALKPHOS 154* 143*  BILITOT 0.7 0.8  PROT 8.7* 8.2*  ALBUMIN 4.0 3.7   No results for input(s): LIPASE, AMYLASE in the last 72 hours. CBC:  Recent Labs  04/04/16 0251 04/05/16 0408  WBC 9.8 8.6  HGB 10.7* 10.2*  HCT 32.4* 30.9*  MCV 86.1 85.7  PLT 155 150   Cardiac Enzymes:  Recent Labs  04/03/16 1433 04/03/16 2015 04/04/16 0251  TROPONINI 0.05* 0.04* 0.06*   BNP: Invalid input(s): POCBNP D-Dimer: No results for input(s): DDIMER in the last 72 hours. Hemoglobin A1C:  Recent  Labs  04/03/16 1433  HGBA1C 4.7*   Fasting Lipid Panel: No results for input(s): CHOL, HDL, LDLCALC, TRIG, CHOLHDL, LDLDIRECT in the last 72 hours. Thyroid Function Tests:  Recent Labs  04/03/16 1433  TSH 1.156   Anemia Panel: No results for input(s): VITAMINB12, FOLATE, FERRITIN, TIBC, IRON, RETICCTPCT in the last 72 hours.  Dg Chest 2 View  Result Date: 04/03/2016 CLINICAL DATA:  Pt dialysis patient, reports he had dialysis on Friday, was released Saturday and reports shortness of breath started today. Fiance reports pt has been drinking alcohol, smoking cigarettes and eating bad foods for him. Pt isn't able to start new dialysis until Tuesday. Hx/o HT; smoker. EXAM: CHEST  2 VIEW COMPARISON:  None. FINDINGS: Cardiac silhouette is top-normal in size. No mediastinal or hilar masses. No convincing adenopathy. There are irregular interstitial opacities and hazy airspace opacity bilaterally, most evident on the right. Thickening of the fissures is noted. The lungs are hyperexpanded. There is no pleural effusion. No pneumothorax. Skeletal structures are  intact. IMPRESSION: 1. Interstitial and hazy airspace opacities most evident on the right. Pneumonia is suspected. Alternatively, this could reflect asymmetric pulmonary edema. Electronically Signed   By: Amie Portland M.D.   On: 04/03/2016 11:52   Dg Chest Port 1 View  Result Date: 04/04/2016 CLINICAL DATA:  Pulmonary edema. EXAM: PORTABLE CHEST 1 VIEW COMPARISON:  04/03/2016. FINDINGS: Cardiomegaly. Persistent but slightly improved bilateral pulmonary infiltrates, right side greater than left, noted. Findings suggest improving pulmonary edema. Bilateral pneumonia cannot be excluded. No pleural effusion or pneumothorax. IMPRESSION: Cardiomegaly with persistent but slightly improved bilateral pulmonary infiltrates, right side greater than left. Findings consistent with improving pulmonary edema. Electronically Signed   By: Maisie Fus  Register   On:  04/04/2016 06:38     Echo: pending  TELEMETRY: NSR, 62 bpm  ASSESSMENT AND PLAN:  Active Problems:   Acute pulmonary edema (HCC)   Acute respiratory failure with hypoxia (HCC)   Malignant essential hypertension   ESRD (end stage renal disease) (HCC)   Elevated troponin   Leukocytosis   Anemia    1. Congestive heart failure, breathing better this morning, pulmonary edema improved on repeat chest xray 2. Chest pain, atypical, with coughing, borderline elevated troponin 0.05 to 0.04 to 0.06 most recently, likely demand ischemia, ECG non-diagnostic 3. End stage renal disease  Recommendations 1. Agree with current therapy 2. Review echocardiogram 3. Defer full dose anticoagulation 4. No further cardiac diagnostics  Leanora Ivanoff, PA-C 04/05/2016 8:21 AM

## 2016-04-05 NOTE — Progress Notes (Signed)
HD initiated without issue. Patient currently has no complaints  

## 2016-04-05 NOTE — Progress Notes (Signed)
Pre HD  

## 2016-04-06 MED ORDER — HYDRALAZINE HCL 25 MG PO TABS: 75.0000 mg | ORAL_TABLET | Freq: Three times a day (TID) | ORAL | 2 refills | Status: AC

## 2016-04-06 MED ORDER — GUAIFENESIN-DM 100-10 MG/5ML PO SYRP: 5.0000 mL | ORAL_SOLUTION | ORAL | 0 refills | Status: AC | PRN

## 2016-04-06 NOTE — Progress Notes (Signed)
04/06/2016 11:42 AM  BP (!) 144/80 (BP Location: Right Arm)   Pulse 72   Temp 98.4 F (36.9 C) (Oral)   Resp 18   Ht 6' (1.829 m)   Wt 69.9 kg (154 lb)   SpO2 97%   BMI 20.89 kg/m  Patient discharged per MD orders. Discharge instructions reviewed with patient and patient verbalized understanding. IV removed per policy. Prescriptions discussed and given to patient. Discharged ambulatory escorted by family member.  Ron Thaxton, RN

## 2016-04-06 NOTE — Progress Notes (Signed)
[facility]  ROUNDING NOTE   Subjective:  Late entry. Patient seen prior to discharge. Counseled extensively today regarding fluid intake. He verbalized understanding. Will start outpt dialysis in [place] tomorrow.   Objective:  Vital signs in last 24 hours:  Temp:  [98.4 F (36.9 C)-98.6 F (37 C)] 98.4 F (36.9 C) (04/04 0449) Pulse Rate:  [66-91] 72 (04/04 0449) Resp:  [9-20] 18 (04/04 0449) BP: (144-179)/(80-89) 144/80 (04/04 0449) SpO2:  [91 %-100 %] 97 % (04/04 0449)  Weight change: -2.846 kg (-6 lb 4.4 oz) Filed Weights   04/04/16 1245 04/04/16 1540 04/05/16 1004  Weight: 72.7 kg (160 lb 4.4 oz) 71.5 kg (157 lb 10.1 oz) 69.9 kg (154 lb)    Intake/Output: I/O last 3 completed shifts: In: 486 [P.O.:480; I.V.:6] Out: 2500 [Other:2500]   Intake/Output this shift:  Total I/O In: 240 [P.O.:240] Out: -   Physical Exam: General: No acute distress  Head: Normocephalic, atraumatic. Moist oral mucosal membranes  Eyes: Anicteric  Neck: Supple, trachea midline  Lungs:  CTAB, normal effort  Heart: S1S2 no rubs  Abdomen:  Soft, nontender  Extremities: Trace peripheral edema.  Neurologic: Nonfocal, moving all four extremities  Skin: No lesions       Basic Metabolic Panel:  Recent Labs Lab 04/03/16 1133 04/03/16 1600 04/04/16 0251 04/05/16 0408 04/05/16 1010  NA 141 140 138 139  --   K 4.6 4.1 4.2 4.2  --   CL 102 100* 97* 98*  --   CO2 32  --   GLUCOSE 93 88 110* 79  --   BUN 56* 48* 34* 46*  --   CREATININE 9.21* 7.73* 6.12* 5.97*  --   CALCIUM 9.1 8.9 8.7* 9.0  --   MG  --   --  1.8  --   --   PHOS  --  3.5 3.0 3.4 3.4    Liver Function Tests:  Recent Labs Lab 04/03/16 1133 04/03/16 1600  AST 32 26  ALT 28 25  ALKPHOS 154* 143*  BILITOT 0.7 0.8  PROT 8.7* 8.2*  ALBUMIN 4.0 3.7   No results for input(s): LIPASE, AMYLASE in the last 168 hours. No results for input(s): AMMONIA in the last 168 hours.  CBC:  Recent  Labs Lab 04/03/16 1133 04/04/16 0251 04/05/16 0408  WBC 11.4* 9.8 8.6  HGB 11.8* 10.7* 10.2*  HCT 35.7* 32.4* 30.9*  MCV 86.7 86.1 85.7  PLT 186 155 150    Cardiac Enzymes:  Recent Labs Lab 04/03/16 1133 04/03/16 1433 04/03/16 2015 04/04/16 0251  TROPONINI 0.03* 0.05* 0.04* 0.06*    BNP: Invalid input(s): POCBNP  CBG:  Recent Labs Lab 04/03/16 1431  GLUCAP 87    Microbiology: Results for orders placed or performed during the hospital encounter of 04/03/16  Blood culture (routine x 2)     Status: None (Preliminary result)   Collection Time: 04/03/16 12:31 PM  Result Value Ref Range Status   Specimen Description BLOOD RIGHT FOREARM  Final   Special Requests   Final    BOTTLES DRAWN AEROBIC AND ANAEROBIC Blood Culture adequate volume   Culture NO GROWTH 3 DAYS  Final   Report Status PENDING  Incomplete  Blood culture (routine x 2)     Status: None (Preliminary result)   Collection Time: 04/03/16  1:18 PM  Result Value Ref Range Status   Specimen Description BLOOD right forearm  Final   Special Requests   Final  BOTTLES DRAWN AEROBIC AND ANAEROBIC Blood Culture results may not be optimal due to an excessive volume of blood received in culture bottles   Culture NO GROWTH 3 DAYS  Final   Report Status PENDING  Incomplete  MRSA PCR Screening     Status: None   Collection Time: 04/03/16  2:28 PM  Result Value Ref Range Status   MRSA by PCR NEGATIVE NEGATIVE Final    Comment:        The GeneXpert MRSA Assay (FDA approved for NASAL specimens only), is one component of a comprehensive MRSA colonization surveillance program. It is not intended to diagnose MRSA infection nor to guide or monitor treatment for MRSA infections.     Coagulation Studies: No results for input(s): LABPROT, INR in the last 72 hours.  Urinalysis: No results for input(s): COLORURINE, LABSPEC, PHURINE, GLUCOSEU, HGBUR, BILIRUBINUR, KETONESUR, PROTEINUR, UROBILINOGEN, NITRITE,  LEUKOCYTESUR in the last 72 hours.  Invalid input(s): APPERANCEUR    Imaging: No results found.   Medications:    . amLODipine  10 mg Oral Daily  . aspirin EC  81 mg Oral Daily  . hydrALAZINE  75 mg Oral TID  . levofloxacin  500 mg Oral Q48H  . losartan  100 mg Oral Daily  . metoprolol  100 mg Oral BID  . nitroGLYCERIN  1 inch Topical Q6H  . sodium chloride flush  3 mL Intravenous Q12H   sodium chloride, acetaminophen **OR** acetaminophen, guaiFENesin-dextromethorphan, hydrALAZINE, HYDROcodone-acetaminophen, morphine injection, ondansetron **OR** ondansetron (ZOFRAN) IV, sodium chloride flush  Assessment/ Plan:  61 y.o. male with End Stage Renal Disease on hemodialysis since 09/2014 with left arm AVG, hypertension, diabetes mellitus type II, cholecystectomy, who was admitted to [facility] on 04/03/2016   TTS [facility] [place] Left Arm AVG  1. End Stage Renal Disease on hemodialysis: with pulmonary edema and accelerated hypertension.  - Patient improved significantly with consecutive days of dialysis. He will be dialyzing as an outpatient tomorrow. No acute indication for dialysis today.  2. Hypertension: Blood pressure currently 144/80.  Continue amlodipine, hydralazine, losartan, and metoprolol.  3. Anemia of chronic kidney disease: Recommend continued monitoring of CBC has an outpatient. No indication for Epogen at the moment.  4. Secondary Hyperparathyroidism:  Recommend continued monitoring of PTH, phosphorus, and calcium as an outpatient.   LOS: 3 Kaiven Vester 4/4/201812:07 PM

## 2016-04-06 NOTE — Discharge Summary (Signed)
[facility] [place] at [facility]   PATIENT NAME: Terry Riley    MR#:  [id number]  DATE OF BIRTH:  [birth year]  DATE OF ADMISSION:  04/03/2016   ADMITTING PHYSICIAN: Katharina Caper, MD  DATE OF DISCHARGE: 04/06/2016 12:30 PM  PRIMARY CARE PHYSICIAN: Pcp Not In System   ADMISSION DIAGNOSIS:  Pulmonary edema [J81.1] Hypoxia [R09.02] Community acquired pneumonia of right lung, unspecified part of lung [J18.9] DISCHARGE DIAGNOSIS:  Active Problems:   Acute pulmonary edema (HCC)   Acute respiratory failure with hypoxia (HCC)   Malignant essential hypertension   ESRD (end stage renal disease) (HCC)   Elevated troponin   Leukocytosis   Anemia  SECONDARY DIAGNOSIS:   Past Medical History:  Diagnosis Date  . Hypertension   . Renal disorder    HOSPITAL COURSE:   #1. Acute respiratory failure with hypoxia due to acute pulmonary edema and no pneumonia. He got hemodialysis emergently,  wean off O2 [place].  chest x-ray shows better pulmonary edema.  He is treated  levofloxacin orally due to suspected pneumonia. But there is no evidence of pneumonia.  #2. Acute pulmonary edema due to fluid overload. Systolic function was normal. The estimated ejection fraction was   in the range of 55% to 60% per echocardiogram, continue nitroglycerin topically, continue hemodialysis.  #3. Malignant essential hypertension, blood pressure is better controlled. resumed all outpatient medications, on nitroglycerin topically. IV hydralazine when necessary.  #4. Elevated troponin, due to demanding ischemia. continue metoprolol, aspirin.  #5. Leukocytosis, improved.  #6. Tobacco abuse. Counseling, discussed this patient for 4 minutes, nicotine replacement therapy.  DISCHARGE CONDITIONS:  Stable, discharge to home today. CONSULTS OBTAINED:  Treatment Team:  Merwyn Katos, MD Marcina Millard, MD DRUG ALLERGIES:  No Known Allergies DISCHARGE MEDICATIONS:   Allergies  as of 04/06/2016   No Known Allergies     Medication List    TAKE these medications   amLODipine 10 MG tablet Commonly known as:  NORVASC Take 10 mg by mouth daily.   guaiFENesin-dextromethorphan 100-10 MG/5ML syrup Commonly known as:  ROBITUSSIN DM Take 5 mLs by mouth every 4 (four) hours as needed for cough.   hydrALAZINE 25 MG tablet Commonly known as:  APRESOLINE Take 3 tablets (75 mg total) by mouth 3 (three) times daily. What changed:  medication strength  how much to take   losartan 100 MG tablet Commonly known as:  COZAAR Take 100 mg by mouth daily.   metoprolol 100 MG tablet Commonly known as:  LOPRESSOR Take 100 mg by mouth 2 (two) times daily.        DISCHARGE INSTRUCTIONS:  See AVS.  If you experience worsening of your admission symptoms, develop shortness of breath, life threatening emergency, suicidal or homicidal thoughts you must seek medical attention immediately by calling 911 or calling your MD immediately  if symptoms less severe.  You Must read complete instructions/literature along with all the possible adverse reactions/side effects for all the Medicines you take and that have been prescribed to you. Take any new Medicines after you have completely understood and accpet all the possible adverse reactions/side effects.   Please note  You were cared for by a hospitalist during your hospital stay. If you have any questions about your discharge medications or the care you received while you were in the hospital after you are discharged, you can call the unit and asked to speak with the hospitalist on call if the hospitalist that took care of you is  not available. Once you are discharged, your primary care physician will handle any further medical issues. Please note that NO REFILLS for any discharge medications will be authorized once you are discharged, as it is imperative that you return to your primary care physician (or establish a relationship with a  primary care physician if you do not have one) for your aftercare needs so that they can reassess your need for medications and monitor your lab values.    On the day of Discharge:  VITAL SIGNS:  Blood pressure (!) 144/80, pulse 72, temperature 98.4 F (36.9 C), temperature source Oral, resp. rate 18, height 6' (1.829 m), weight 154 lb (69.9 kg), SpO2 97 %. PHYSICAL EXAMINATION:  GENERAL:  61 y.o.-year-old patient lying in the bed with no acute distress.  EYES: Pupils equal, round, reactive to light and accommodation. No scleral icterus. Extraocular muscles intact.  HEENT: Head atraumatic, normocephalic. Oropharynx and nasopharynx clear.  NECK:  Supple, no jugular venous distention. No thyroid enlargement, no tenderness.  LUNGS: Normal breath sounds bilaterally, no wheezing, mild rales, no rhonchi or crepitation. No use of accessory muscles of respiration.  CARDIOVASCULAR: S1, S2 normal. No murmurs, rubs, or gallops.  ABDOMEN: Soft, non-tender, non-distended. Bowel sounds present. No organomegaly or mass.  EXTREMITIES: No pedal edema, cyanosis, or clubbing.  NEUROLOGIC: Cranial nerves II through XII are intact. Muscle strength 5/5 in all extremities. Sensation intact. Gait not checked.  PSYCHIATRIC: The patient is alert and oriented x 3.  SKIN: No obvious rash, lesion, or ulcer.  DATA REVIEW:   CBC  Recent Labs Lab 04/05/16 0408  WBC 8.6  HGB 10.2*  HCT 30.9*  PLT 150    Chemistries   Recent Labs Lab 04/03/16 1600 04/04/16 0251 04/05/16 0408  NA 140 138 139  K 4.1 4.2 4.2  CL 100* 97* 98*  CO2 26 31 32  GLUCOSE 88 110* 79  BUN 48* 34* 46*  CREATININE 7.73* 6.12* 5.97*  CALCIUM 8.9 8.7* 9.0  MG  --  1.8  --   AST 26  --   --   ALT 25  --   --   ALKPHOS 143*  --   --   BILITOT 0.8  --   --      Microbiology Results  Results for orders placed or performed during the hospital encounter of 04/03/16  Blood culture (routine x 2)     Status: None (Preliminary result)    Collection Time: 04/03/16 12:31 PM  Result Value Ref Range Status   Specimen Description BLOOD RIGHT FOREARM  Final   Special Requests   Final    BOTTLES DRAWN AEROBIC AND ANAEROBIC Blood Culture adequate volume   Culture NO GROWTH 3 DAYS  Final   Report Status PENDING  Incomplete  Blood culture (routine x 2)     Status: None (Preliminary result)   Collection Time: 04/03/16  1:18 PM  Result Value Ref Range Status   Specimen Description BLOOD right forearm  Final   Special Requests   Final    BOTTLES DRAWN AEROBIC AND ANAEROBIC Blood Culture results may not be optimal due to an excessive volume of blood received in culture bottles   Culture NO GROWTH 3 DAYS  Final   Report Status PENDING  Incomplete  MRSA PCR Screening     Status: None   Collection Time: 04/03/16  2:28 PM  Result Value Ref Range Status   MRSA by PCR NEGATIVE NEGATIVE Final    Comment:  The GeneXpert MRSA Assay (FDA approved for NASAL specimens only), is one component of a comprehensive MRSA colonization surveillance program. It is not intended to diagnose MRSA infection nor to guide or monitor treatment for MRSA infections.     RADIOLOGY:  No results found.   Management plans discussed with the patient, family and they are in agreement.  CODE STATUS: Prior   TOTAL TIME TAKING CARE OF THIS PATIENT: 33 minutes.    Shaune Pollack M.D on 04/06/2016 at 5:36 PM  Between 7am to 6pm - Pager - [phone]  After 6pm go to [email or web address] - Social research officer, government  [facility]  Office  [phone]  CC: Primary care physician; Pcp Not In System   Note: This dictation was prepared with Dragon dictation along with smaller phrase technology. Any transcriptional errors that result from this process are unintentional.

## 2016-04-06 NOTE — Care Management (Addendum)
Patient to discharge today.  [facility] HD liaison aware of discharge.  Patient to arrive at outpatient HD tomorrow at 10:30 am.  Patient has been updated. Patient has been weaned to RA, and will not require acute O2 at discharge.  RNCM signing off

## 2016-04-06 NOTE — Discharge Instructions (Signed)
Renal and heart healthy diet. Smoking cessation.

## 2016-04-08 LAB — CULTURE, BLOOD (ROUTINE X 2)
CULTURE: NO GROWTH
CULTURE: NO GROWTH
Special Requests: ADEQUATE

## 2016-04-08 LAB — OPIATES,MS,WB/SP RFX
6-Acetylmorphine: NEGATIVE
Codeine: NEGATIVE ng/mL
DIHYDROCODEINE: NEGATIVE ng/mL
HYDROMORPHONE: NEGATIVE ng/mL
Hydrocodone: NEGATIVE ng/mL
MORPHINE: NEGATIVE ng/mL
Opiate Confirmation: NEGATIVE

## 2016-04-08 LAB — DRUG SCREEN 10 W/CONF, SERUM
Amphetamines, IA: NEGATIVE ng/mL
Barbiturates, IA: NEGATIVE ug/mL
Benzodiazepines, IA: NEGATIVE ng/mL
COCAINE & METABOLITE, IA: NEGATIVE ng/mL
METHADONE, IA: NEGATIVE ng/mL
OXYCODONES, IA: NEGATIVE ng/mL
Opiates, IA: NEGATIVE ng/mL
PHENCYCLIDINE, IA: NEGATIVE ng/mL
Propoxyphene, IA: NEGATIVE ng/mL
THC(MARIJUANA) METABOLITE, IA: NEGATIVE ng/mL

## 2016-04-08 LAB — OXYCODONES,MS,WB/SP RFX
OXYCODONES CONFIRMATION: NEGATIVE
OXYMORPHONE: NEGATIVE ng/mL
Oxycocone: NEGATIVE ng/mL

## 2017-03-03 DEATH — deceased

## 2017-10-24 IMAGING — DX DG CHEST 1V PORT
1 series · 1 of 1 positions shown · non-contrast
Comparison: 04/03/2016.

CLINICAL DATA: Pulmonary edema.

EXAM:
PORTABLE CHEST 1 VIEW

[chest ap]
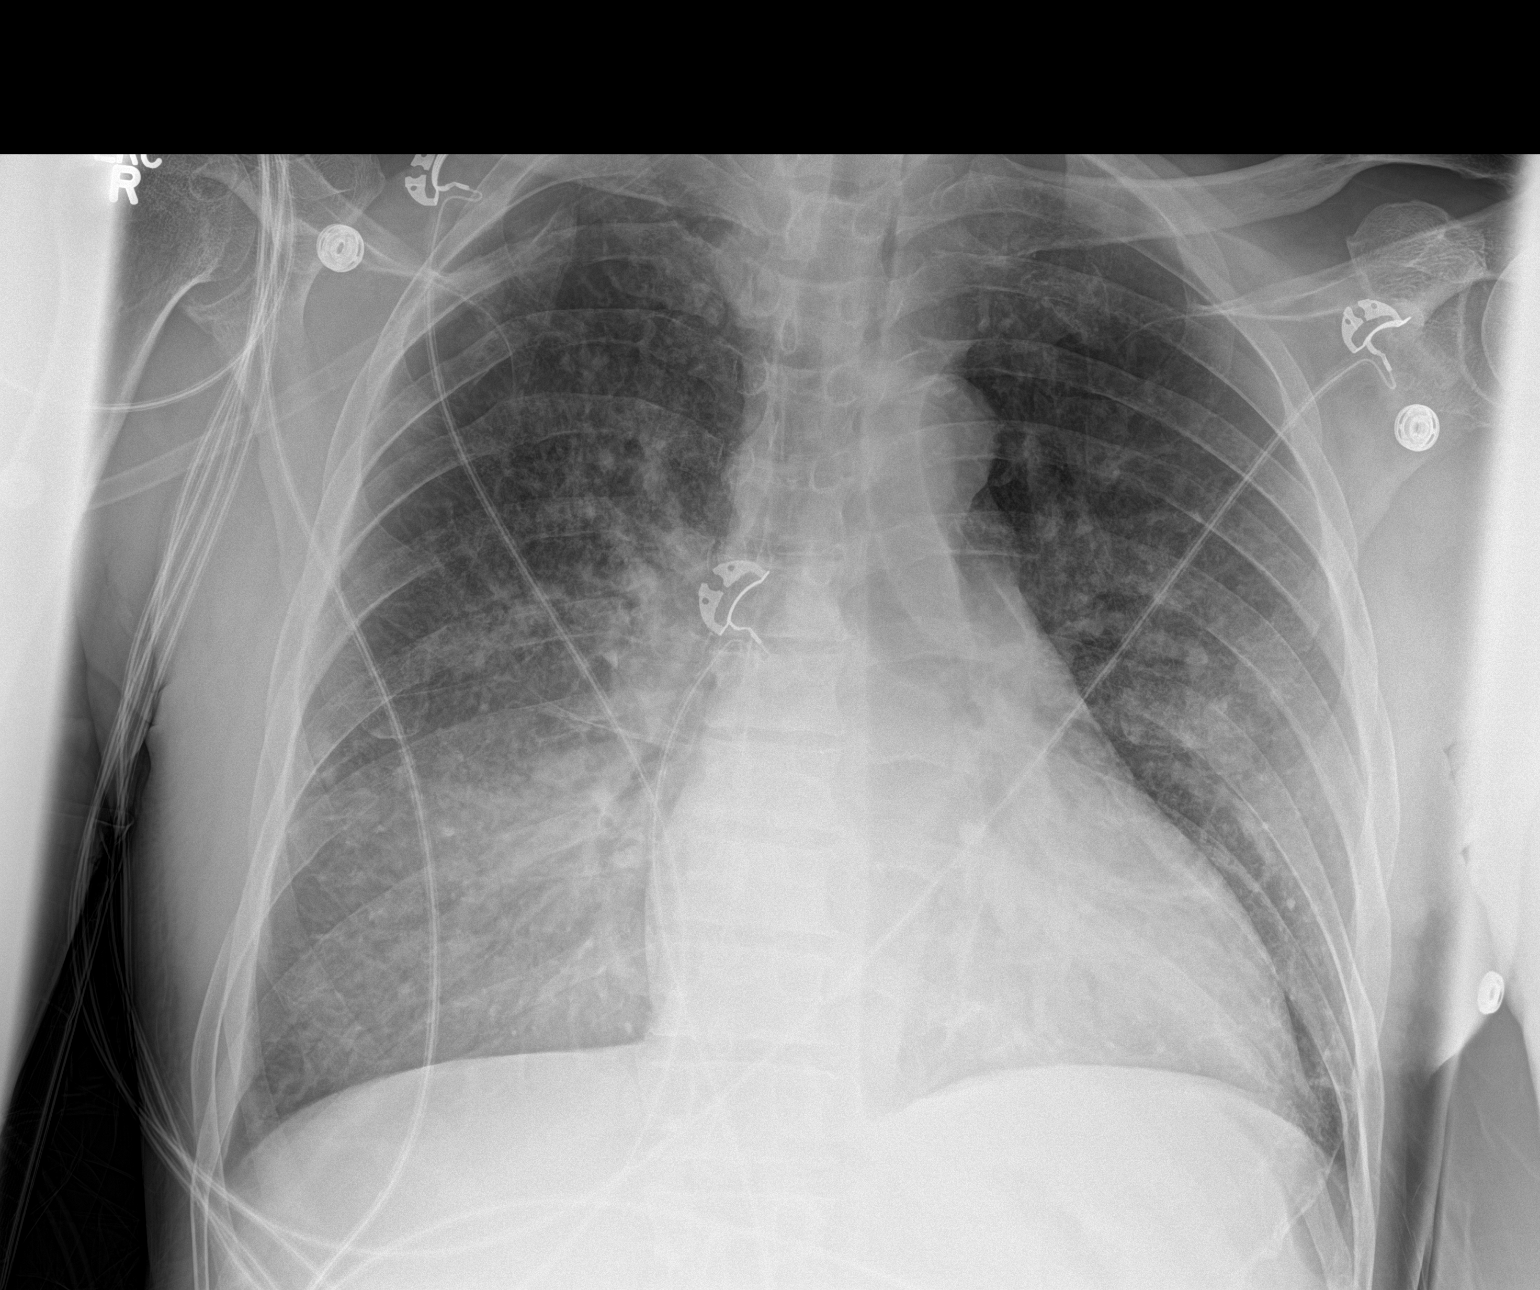

[1 of 1 positions shown; findings below may reference images not displayed]

FINDINGS: Cardiomegaly. Persistent but slightly improved bilateral pulmonary
infiltrates, right side greater than left, noted. Findings suggest
improving pulmonary edema. Bilateral pneumonia cannot be excluded.
No pleural effusion or pneumothorax.
IMPRESSION: Cardiomegaly with persistent but slightly improved bilateral
pulmonary infiltrates, right side greater than left. Findings
consistent with improving pulmonary edema.
